# Patient Record
Sex: Male | Born: 1970 | Race: White | Hispanic: No | Smoking: Never smoker
Health system: Southern US, Community
[De-identification: ages and names within clinical notes are randomized; demographics above are authoritative.]

## PROBLEM LIST (undated history)

## (undated) ENCOUNTER — Emergency Department (HOSPITAL_COMMUNITY): Discharge status: Absent without leave

## (undated) DIAGNOSIS — Z800 Family history of malignant neoplasm of digestive organs: Secondary | ICD-10-CM

## (undated) DIAGNOSIS — T7840XA Allergy, unspecified, initial encounter: Secondary | ICD-10-CM

## (undated) DIAGNOSIS — E785 Hyperlipidemia, unspecified: Secondary | ICD-10-CM

## (undated) DIAGNOSIS — M1990 Unspecified osteoarthritis, unspecified site: Secondary | ICD-10-CM

## (undated) DIAGNOSIS — F988 Other specified behavioral and emotional disorders with onset usually occurring in childhood and adolescence: Secondary | ICD-10-CM

## (undated) HISTORY — DX: Hyperlipidemia, unspecified: E78.5

## (undated) HISTORY — DX: Other specified behavioral and emotional disorders with onset usually occurring in childhood and adolescence: F98.8

## (undated) HISTORY — PX: NASAL FRACTURE SURGERY: SHX718

## (undated) HISTORY — DX: Family history of malignant neoplasm of digestive organs: Z80.0

## (undated) HISTORY — PX: TONSILLECTOMY: SUR1361

## (undated) HISTORY — PX: WISDOM TOOTH EXTRACTION: SHX21

## (undated) HISTORY — DX: Allergy, unspecified, initial encounter: T78.40XA

## (undated) HISTORY — DX: Unspecified osteoarthritis, unspecified site: M19.90

---

## 2005-02-12 ENCOUNTER — Ambulatory Visit (HOSPITAL_COMMUNITY): Admission: RE | Admit: 2005-02-12 | Discharge: 2005-02-12 | Payer: Self-pay | Admitting: General Surgery

## 2006-12-29 ENCOUNTER — Ambulatory Visit (HOSPITAL_COMMUNITY): Admission: RE | Admit: 2006-12-29 | Discharge: 2006-12-29 | Payer: Self-pay | Admitting: Oral Surgery

## 2009-05-30 ENCOUNTER — Emergency Department (HOSPITAL_COMMUNITY)
Admission: EM | Admit: 2009-05-30 | Discharge: 2009-05-31 | Payer: Self-pay | Source: Home / Self Care | Admitting: Emergency Medicine

## 2010-09-13 NOTE — H&P (Signed)
Marcus Jones, Marcus Jones          ACCOUNT NO.:  [id number]   MEDICAL RECORD NO.:  [id number]          PATIENT TYPE:  AMB   LOCATION:                                FACILITY:  [facility]   PHYSICIAN:  Dalia Heading, M.D.  DATE OF BIRTH:  03-30-1971   DATE OF ADMISSION:  02/12/2005  DATE OF DISCHARGE:  LH                                HISTORY & PHYSICAL   CHIEF COMPLAINT:  Hematochezia with family history of colon carcinoma.   HISTORY OF PRESENT ILLNESS:  The patient is a 40 year old, white male who is  referred for endoscopic evaluation.  He needs a colonoscopy for  hematochezia.  This has been present intermittently over the past year.  Blood is noted in the toilet bowl.  No abdominal pain, weight loss, nausea,  vomiting, constipation, diarrhea, melena have been noted.  He has never had  a colonoscopy.  Both parents suffer from colon cancer.  There is no history  of hemorrhoidal disease.   PAST MEDICAL HISTORY:  Unremarkable.   PAST SURGICAL HISTORY:  1.  Tonsillectomy.  2.  Colon screening in 1994.   CURRENT MEDICATIONS:  Tylenol or ibuprofen as needed.   ALLERGIES:  AMOXICILLIN, PENICILLIN, LEVAQUIN, SULFA.   REVIEW OF SYSTEMS:  Noncontributory.   PHYSICAL EXAMINATION:  GENERAL:  The patient is a well-developed, well-  nourished, white male in no acute distress.  He is afebrile with vital signs  stable.  LUNGS:  Clear to auscultation with equal breath sounds bilaterally.  HEART:  Regular rate and rhythm without S3, S4 or murmurs.  ABDOMEN:  Soft, nontender, nondistended, no hepatosplenomegaly or masses are  noted.  RECTAL:  Deferred to the procedure.   IMPRESSION:  1.  Hematochezia.  2.  Family history of colon carcinoma.   PLAN:  The patient is scheduled for a colonoscopy on February 12, 2005.  The  risks and benefits of the procedure including bleeding and perforation were  fully explained to the patient gaining informed consent.      Dalia Heading, M.D.  Electronically Signed     MAJ/MEDQ  D:  02/04/2005  T:  02/04/2005  Job:  [id number]   cc:   Patrica Duel, M.D.  Fax: [phone]

## 2012-06-28 ENCOUNTER — Encounter: Payer: Self-pay | Admitting: Gastroenterology

## 2012-07-08 ENCOUNTER — Encounter: Payer: Self-pay | Admitting: *Deleted

## 2012-07-15 ENCOUNTER — Encounter: Payer: Self-pay | Admitting: Gastroenterology

## 2012-07-15 ENCOUNTER — Ambulatory Visit (INDEPENDENT_AMBULATORY_CARE_PROVIDER_SITE_OTHER): Payer: PRIVATE HEALTH INSURANCE | Admitting: Gastroenterology

## 2012-07-15 VITALS — BP 100/74 | HR 80 | Ht 72.0 in | Wt 195.2 lb

## 2012-07-15 DIAGNOSIS — K625 Hemorrhage of anus and rectum: Secondary | ICD-10-CM

## 2012-07-15 DIAGNOSIS — Z800 Family history of malignant neoplasm of digestive organs: Secondary | ICD-10-CM

## 2012-07-15 DIAGNOSIS — K219 Gastro-esophageal reflux disease without esophagitis: Secondary | ICD-10-CM

## 2012-07-15 MED ORDER — MOVIPREP 100 G PO SOLR: 1.0000 | Freq: Once | ORAL | Status: DC

## 2012-07-15 NOTE — Patient Instructions (Addendum)
  You have been scheduled for a colonoscopy. Please follow written instructions given to you at your visit today.  Please pick up your prep kit at the pharmacy within the next 1-3 days. If you use inhalers (even only as needed), please bring them with you on the day of your procedure.    __________________________________________________________________________________________                                               We are excited to introduce MyChart, a new best-in-class service that provides you online access to important information in your electronic medical record. We want to make it easier for you to view your health information - all in one secure location - when and where you need it. We expect MyChart will enhance the quality of care and service we provide.  When you register for MyChart, you can:    View your test results.    Request appointments and receive appointment reminders via email.    Request medication renewals.    View your medical history, allergies, medications and immunizations.    Communicate with your physician's office through a password-protected site.    Conveniently print information such as your medication lists.  To find out if MyChart is right for you, please talk to a member of our clinical staff today. We will gladly answer your questions about this free health and wellness tool.  If you are age 41 or older and want a member of your family to have access to your record, you must provide written consent by completing a proxy form available at our office. Please speak to our clinical staff about guidelines regarding accounts for patients younger than age 41.  As you activate your MyChart account and need any technical assistance, please call the MyChart technical support line at [phone]) or email your question to [email or web address]. If you email your question(s), please include your name, a return phone number and the  best time to reach you.  If you have non-urgent health-related questions, you can send a message to our office through MyChart at [email or web address]. If you have a medical emergency, call 911.  Thank you for using MyChart as your new health and wellness resource!   MyChart licensed from [employer],  4540-9811. Patents Pending.

## 2012-07-15 NOTE — Progress Notes (Signed)
History of Present Illness:  This is a 42 year old Caucasian male who has had asymptomatic hemorrhoidal bleeding for several years.  He has a strong family history of colon cancer with his father having colon cancer at age 50, his father apparently has had several relapses of his colon cancer with repeat recent surgery.  Thayer Ohm had colonoscopy apparently 7 years ago which was unremarkable.  I do not have that report for review.  He has almost daily bright red blood per rectum and has previously been diagnosed with internal hemorrhoids, and uses when necessary Cortizone salve and suppositories.  He denies abdominal pain, bowel or regularity, any upper GI complaints except for occasional GERD without associated dysphasia or hepatobiliary symptoms.  His appetite is good and his weight is stable.  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  He also has a history of allergies to Keflex, Levaquin, and sulfa antibiotics.   I have reviewed this patient's present history, medical and surgical past history, allergies and medications.     ROS:   All systems were reviewed and are negative unless otherwise stated in the HPI.    Physical Exam: Repair patient in no distress.  Blood pressure 100/74, pulse 80 and regular, weight 195 with a BMI of 26.47.  98% oxygen saturation. General well developed well nourished patient in no acute distress, appearing their stated age Eyes PERRLA, no icterus, fundoscopic exam per opthamologist Skin no lesions noted Neck supple, no adenopathy, no thyroid enlargement, no tenderness Chest clear to percussion and auscultation Heart no significant murmurs, gallops or rubs noted Abdomen no hepatosplenomegaly masses or tenderness, BS normal.  Rectal inspection normal no fissures, or fistulae noted.  There is a small posterior skin tag noted but digital exam was not performed.  He apparently had recent US to the exam and Dr. Oneta Rack and  guaiac-negative stool. Extremities no acute joint lesions, edema, phlebitis or evidence of cellulitis. Neurologic patient oriented x 3, cranial nerves intact, no focal neurologic deficits noted. Psychological mental status normal and normal affect.  Assessment and plan: Probably recurrent hemorrhoidal bleeding in a 42 year old patient with a strong family history of colon cancer in his father.  We will schedule his colonoscopy and possible hemorrhoidal injection therapy versus banding pending on his colonoscopy results.  Review of his labs shows normal CBC without evidence of anemia.  Normal B12 and liver function tests.  Also urinalysis and prostate exam was normal.  Because of his egg allergy, we will use conscious sedation with fentanyl and Versed and not propofol.  This patient has a past history of mild GERD, but I do not think he needs endoscopy at this time.  No diagnosis found.

## 2012-07-21 ENCOUNTER — Telehealth: Payer: Self-pay | Admitting: Gastroenterology

## 2012-07-21 NOTE — Telephone Encounter (Signed)
Dr. Lacretia Leigh office requested the office note from 07/15/2012 on 07/21/2012   asw  4 pages were faxed to Dr. Kathryne Sharper office on 07/21/2012  asw

## 2012-07-22 ENCOUNTER — Encounter: Payer: Self-pay | Admitting: Gastroenterology

## 2012-08-11 ENCOUNTER — Other Ambulatory Visit: Payer: PRIVATE HEALTH INSURANCE | Admitting: Gastroenterology

## 2013-02-02 ENCOUNTER — Encounter (INDEPENDENT_AMBULATORY_CARE_PROVIDER_SITE_OTHER): Payer: Self-pay

## 2013-02-02 ENCOUNTER — Ambulatory Visit (INDEPENDENT_AMBULATORY_CARE_PROVIDER_SITE_OTHER): Payer: PRIVATE HEALTH INSURANCE | Admitting: Neurology

## 2013-02-02 ENCOUNTER — Encounter: Payer: Self-pay | Admitting: Neurology

## 2013-02-02 VITALS — BP 102/64 | HR 80 | Ht 72.0 in | Wt 199.0 lb

## 2013-02-02 DIAGNOSIS — R209 Unspecified disturbances of skin sensation: Secondary | ICD-10-CM

## 2013-02-02 DIAGNOSIS — R202 Paresthesia of skin: Secondary | ICD-10-CM

## 2013-02-02 DIAGNOSIS — R531 Weakness: Secondary | ICD-10-CM

## 2013-02-02 DIAGNOSIS — R5381 Other malaise: Secondary | ICD-10-CM

## 2013-02-02 MED ORDER — GABAPENTIN 300 MG PO CAPS: ORAL_CAPSULE | ORAL | Status: DC

## 2013-02-02 NOTE — Patient Instructions (Signed)
Overall you are doing fairly well but I do want to suggest a few things today:   As far as your medications are concerned, I would like to suggest starting you on Gabapentin 300mg  three times a day  As far as diagnostic testing: I would like to order a MRI of the brain and a lab test called ANA  I would like to see you back in 4 months, sooner if we need to. Please call us with any interim questions, concerns, problems, updates or refill requests.   Please also call us for any test results so we can go over those with you on the phone.  My clinical assistant and will answer any of your questions and relay your messages to me and also relay most of my messages to you.   Our phone number is [phone]. We also have an after hours call service for urgent matters and there is a physician on-call for urgent questions. For any emergencies you know to call 911 or go to the nearest emergency room

## 2013-02-02 NOTE — Progress Notes (Signed)
[facility]    Provider:  Dr Hosie Poisson Referring Provider: Lucky Cowboy, MD Primary Care Physician:  Nadean Corwin, MD  CC:  Paresthesias and weakness  HPI:  Marcus Jones is a 42 y.o. male here as a referral from Dr. Oneta Rack for multiple motor and sensory changes  Patient sensory with multiple different motor and sensory concerns. Notes numbness and bilateral perioral region which has radiated up to periorbital. This has been going on for around one month. It has slowly started to subside. Denies any weakness facial droop associated with this. He also has a chronic history of hand paresthesias and weakness. Notes difficulty using his hands impaired motor coordination and fine motor skills. This has been going on since the 1990s, will fluctuate. Also notes periods of numbness in his hands and fingers, recently has also began to involve his toes. Notes periods of intermittent sharp stabbing pain, which he describes as trigger points. Happens in his legs and back. Also notes history of back pain. Recently has also developed headaches, these have subsided. His girlfriend notes that he is unsteady on his feet, seems to RRR. He denies feeling unsteady or falling. No lightheaded sensation upon standing. Has erectile dysfunction, well controlled with Viagra. No constipation or bowel bladder changes. He has not had an MRI of the brain or spine done. Believes he has had an EMG done in the past, around 20 years ago which was normal per the patient.   He has a sister diagnosed with MS, has a brother from a different father who has similar symptoms as the patient with an unclear diagnosis, was told he has severe neuropathy. Patient has been on Neurontin in the past for his symptoms, reports this gave good benefit.  Patient has had multiple lab work done from his primary care physician, reviewed data and was unremarkable.  Review of Systems: Out of a complete 14 system  review, the patient complains of only the following symptoms, and all other reviewed systems are negative. Positive for fatigue muscle spasms pain numbness loss of sensation  History   Social History  . Marital Status: Divorced    Spouse Name: N/A    Number of Children: 2  . Years of Education: college    Occupational History  . [employer]    Social History Main Topics  . Smoking status: Never Smoker   . Smokeless tobacco: Never Used  . Alcohol Use: Yes     Comment: 1-2 weekl   . Drug Use: No  . Sexual Activity: Not on file   Other Topics Concern  . Not on file   Social History Narrative   Patient lives at home alone.    Patient is divorced.    Patient has a degree.    Patient has 2 children.     Family History  Problem Relation Age of Onset  . Colon polyps Mother   . Colon cancer Father     x3  . Clotting disorder Father   . Prostate cancer Brother   . Clotting disorder Brother     Past Medical History  Diagnosis Date  . ADD (attention deficit disorder)   . Hyperlipemia   . Arthritis   . Family history of malignant neoplasm of gastrointestinal tract     Past Surgical History  Procedure Laterality Date  . Tonsillectomy    . Wisdom tooth extraction    . Nasal fracture surgery      Current Outpatient Prescriptions  Medication Sig Dispense Refill  .  acyclovir (ZOVIRAX) 200 MG capsule Take 200 mg by mouth daily. 200 mg- 400 mg daily      . amphetamine-dextroamphetamine (ADDERALL XR) 5 MG 24 hr capsule Take 5 mg by mouth every morning. 1-2 qd as needed      . Azelastine HCl (ASTEPRO) 0.15 % SOLN Place into the nose as needed.      . Cholecalciferol (VITAMIN D3) 5000 UNIT/ML LIQD Take by mouth daily.      . cyclobenzaprine (FLEXERIL) 10 MG tablet       . hydrocortisone 2.5 % cream Apply 1 application topically 2 (two) times daily.      Marland Kitchen levocetirizine (XYZAL) 5 MG tablet Take 5 mg by mouth every evening.      . magnesium gluconate (MAGONATE) 500 MG  tablet Take 500 mg by mouth daily.       . meloxicam (MOBIC) 15 MG tablet Take 15 mg by mouth as needed.       . pravastatin (PRAVACHOL) 40 MG tablet Take 40 mg by mouth daily.      Marland Kitchen PROCTOZONE-HC 2.5 % rectal cream       . sildenafil (VIAGRA) 100 MG tablet Take 50 mg by mouth daily as needed for erectile dysfunction.       No current facility-administered medications for this visit.    Allergies as of 02/02/2013 - Review Complete 02/02/2013  Allergen Reaction Noted  . Amoxicillin anhydrous [amoxicillin]  02/02/2013  . Doxycycline  07/08/2012  . Eggs or egg-derived products  07/08/2012  . Keflex [cephalexin]  07/08/2012  . Levaquin [levofloxacin]  07/08/2012  . Neomycin  02/02/2013  . Sulfa antibiotics  07/08/2012    Vitals: BP 102/64  Pulse 80  Ht 6' (1.829 m)  Wt 199 lb (90.266 kg)  BMI 26.98 kg/m2 Last Weight:  Wt Readings from Last 1 Encounters:  02/02/13 199 lb (90.266 kg)   Last Height:   Ht Readings from Last 1 Encounters:  02/02/13 6' (1.829 m)     Physical exam: Exam: Gen: NAD, conversant Eyes: anicteric sclerae, moist conjunctivae HENT: Atraumatic, oropharynx clear Neck: Trachea midline; supple,  Lungs: CTA, no wheezing, rales, rhonic                          CV: RRR, no MRG Abdomen: Soft, non-tender;  Extremities: No peripheral edema  Skin: Normal temperature, no rash,  Psych: Appropriate affect, pleasant  Neuro: MS: AA&Ox3, appropriately interactive, normal affect   Speech: fluent w/o paraphasic error  Memory: good recent and remote recall  CN: PERRL, EOMI no nystagmus, no ptosis, sensation intact to LT V1-V3 bilat, face symmetric, no weakness, hearing grossly intact, palate elevates symmetrically, shoulder shrug 5/5 bilat,  tongue protrudes midline, no fasiculations noted.  Motor: normal bulk and tone Strength: 5/5  In all extremities  Coord: rapid alternating and point-to-point (FNF, HTS) movements intact.  Reflexes: symmetrical, bilat  downgoing toes  Sens: LT intact in all extremities  Gait: posture, stance, stride and arm-swing normal. Tandem gait intact. Able to walk on heels and toes. Romberg absent.   Assessment:  After physical and neurologic examination, review of laboratory studies, imaging, neurophysiology testing and pre-existing records, assessment will be reviewed on the problem list.  Plan:  Treatment plan and additional workup will be reviewed under Problem List.  1)Paresthesias 2)Weakness  Mr Mcnamee is a pleasant 41 year old male presenting for initial evaluation of multiple motor and sensory concerns. Unclear etiology of his symptoms. Based on  age and presenting symptoms would need to rule out multiple sclerosis. Symptoms are consistent with diagnosis of fibromyalgia, though will need to work up other etiologies prior to making this diagnosis. Will check brain MRI with and without contrast to check for MS. Will check ANA. Will restart patient on Neurontin 300 mg 3 times a day.Consider repeating EMG/NCS in the future. Followup in 4 months or earlier as needed.

## 2013-02-03 ENCOUNTER — Encounter: Payer: Self-pay | Admitting: Neurology

## 2013-02-17 ENCOUNTER — Ambulatory Visit (INDEPENDENT_AMBULATORY_CARE_PROVIDER_SITE_OTHER): Payer: PRIVATE HEALTH INSURANCE

## 2013-02-17 ENCOUNTER — Other Ambulatory Visit: Payer: PRIVATE HEALTH INSURANCE

## 2013-02-17 DIAGNOSIS — R202 Paresthesia of skin: Secondary | ICD-10-CM

## 2013-02-17 DIAGNOSIS — R209 Unspecified disturbances of skin sensation: Secondary | ICD-10-CM

## 2013-02-17 DIAGNOSIS — R5381 Other malaise: Secondary | ICD-10-CM

## 2013-02-17 DIAGNOSIS — R531 Weakness: Secondary | ICD-10-CM

## 2013-02-17 MED ORDER — GADOPENTETATE DIMEGLUMINE 469.01 MG/ML IV SOLN: 18.0000 mL | Freq: Once | INTRAVENOUS | Status: AC | PRN

## 2013-02-24 NOTE — Progress Notes (Signed)
Quick Note:  Shared with patient normal MRI results thru VMmessage ______ 

## 2013-03-29 ENCOUNTER — Other Ambulatory Visit: Payer: Self-pay | Admitting: Physician Assistant

## 2013-03-29 MED ORDER — LEVOCETIRIZINE DIHYDROCHLORIDE 5 MG PO TABS: 5.0000 mg | ORAL_TABLET | Freq: Every evening | ORAL | Status: DC

## 2013-04-07 ENCOUNTER — Encounter: Payer: Self-pay | Admitting: Internal Medicine

## 2013-04-08 ENCOUNTER — Encounter: Payer: Self-pay | Admitting: Physician Assistant

## 2013-04-08 ENCOUNTER — Ambulatory Visit (INDEPENDENT_AMBULATORY_CARE_PROVIDER_SITE_OTHER): Payer: PRIVATE HEALTH INSURANCE | Admitting: Physician Assistant

## 2013-04-08 VITALS — BP 100/70 | HR 68 | Temp 98.1°F | Resp 16 | Ht 72.0 in | Wt 198.0 lb

## 2013-04-08 DIAGNOSIS — E559 Vitamin D deficiency, unspecified: Secondary | ICD-10-CM

## 2013-04-08 DIAGNOSIS — E782 Mixed hyperlipidemia: Secondary | ICD-10-CM

## 2013-04-08 DIAGNOSIS — E785 Hyperlipidemia, unspecified: Secondary | ICD-10-CM

## 2013-04-08 DIAGNOSIS — F419 Anxiety disorder, unspecified: Secondary | ICD-10-CM

## 2013-04-08 LAB — CBC WITH DIFFERENTIAL/PLATELET
Basophils Absolute: 0 10*3/uL (ref 0.0–0.1)
Basophils Relative: 0 % (ref 0–1)
HCT: 44.2 % (ref 39.0–52.0)
Lymphocytes Relative: 36 % (ref 12–46)
Lymphs Abs: 2.2 10*3/uL (ref 0.7–4.0)
MCH: 33.1 pg (ref 26.0–34.0)
MCHC: 35.5 g/dL (ref 30.0–36.0)
MCV: 93.1 fL (ref 78.0–100.0)
Monocytes Relative: 9 % (ref 3–12)
RBC: 4.75 MIL/uL (ref 4.22–5.81)
RDW: 12.9 % (ref 11.5–15.5)

## 2013-04-08 LAB — LIPID PANEL
Cholesterol: 178 mg/dL (ref 0–200)
HDL: 45 mg/dL (ref >39)
LDL Cholesterol: 102 mg/dL — ABNORMAL HIGH (ref 0–99)
Total CHOL/HDL Ratio: 4 Ratio
Triglycerides: 154 mg/dL — ABNORMAL HIGH (ref <150)
VLDL: 31 mg/dL (ref 0–40)

## 2013-04-08 LAB — HEPATIC FUNCTION PANEL
ALT: 14 U/L (ref 0–53)
Albumin: 4.6 g/dL (ref 3.5–5.2)
Bilirubin, Direct: 0.1 mg/dL (ref 0.0–0.3)
Indirect Bilirubin: 0.4 mg/dL (ref 0.0–0.9)
Total Bilirubin: 0.5 mg/dL (ref 0.3–1.2)
Total Protein: 7.6 g/dL (ref 6.0–8.3)

## 2013-04-08 LAB — BASIC METABOLIC PANEL WITH GFR
BUN: 19 mg/dL (ref 6–23)
CO2: 26 mEq/L (ref 19–32)
Glucose, Bld: 101 mg/dL — ABNORMAL HIGH (ref 70–99)

## 2013-04-08 NOTE — Progress Notes (Signed)
HPI Patient presents for 3 month follow up with hypertension, hyperlipidemia, prediabetes and vitamin D. Patient's blood pressure has been controlled at home. Patient denies chest pain, shortness of breath, dizziness.  Patient's cholesterol is diet controlled. In addition they are on Pravastatin and denies myalgias. Last LDL was 111.  Patient was sent to Neuro and was had a normal MRI of brain and the neurologist said that likely the symptoms are from [facility] and put him back on gabapentin and he states he doing better now.  Patient is on Vitamin D supplement.  Current Medications:  Current Outpatient Prescriptions on File Prior to Visit  Medication Sig Dispense Refill  . acyclovir (ZOVIRAX) 200 MG capsule Take 200 mg by mouth daily. 200 mg- 400 mg daily      . amphetamine-dextroamphetamine (ADDERALL XR) 5 MG 24 hr capsule Take 5 mg by mouth every morning. 1-2 qd as needed      . Azelastine HCl (ASTEPRO) 0.15 % SOLN Place into the nose as needed.      . Cholecalciferol (VITAMIN D3) 5000 UNIT/ML LIQD Take by mouth daily.      . cyclobenzaprine (FLEXERIL) 10 MG tablet       . gabapentin (NEURONTIN) 300 MG capsule 1 capsule qhs for 3 days then increase to bid for 3 days then increase to 1 capsule three times a day  90 capsule  3  . hydrocortisone 2.5 % cream Apply 1 application topically 2 (two) times daily.      Marland Kitchen levocetirizine (XYZAL) 5 MG tablet Take 1 tablet (5 mg total) by mouth every evening.  30 tablet  2  . meloxicam (MOBIC) 15 MG tablet Take 15 mg by mouth as needed.       . pravastatin (PRAVACHOL) 40 MG tablet Take 40 mg by mouth daily.      Marland Kitchen PROCTOZONE-HC 2.5 % rectal cream       . sildenafil (VIAGRA) 100 MG tablet Take 50 mg by mouth daily as needed for erectile dysfunction.       No current facility-administered medications on file prior to visit.   Medical History:  Past Medical History  Diagnosis Date  . ADD (attention deficit disorder)   . Family history of malignant neoplasm of  gastrointestinal tract   . Arthritis   . Hyperlipemia   . Allergy   . Anxiety    Allergies:  Allergies  Allergen Reactions  . Amoxicillin Anhydrous [Amoxicillin]   . Doxycycline   . Eggs Or Egg-Derived Products   . Keflex [Cephalexin]   . Levaquin [Levofloxacin]   . Neomycin   . Sulfa Antibiotics     ROS Constitutional: Denies fever, chills, weight loss/gain, headaches, insomnia, fatigue, night sweats, and change in appetite. Eyes: Denies redness, blurred vision, diplopia, discharge, itchy, watery eyes.  ENT: Denies discharge, congestion, post nasal drip, sore throat, earache, dental pain, Tinnitus, Vertigo, Sinus pain, snoring.  Cardio: Denies chest pain, palpitations, irregular heartbeat,  dyspnea, diaphoresis, orthopnea, PND, claudication, edema Respiratory: denies cough, dyspnea,pleurisy, hoarseness, wheezing.  Gastrointestinal: Denies dysphagia, heartburn,  water brash, pain, cramps, nausea, vomiting, bloating, diarrhea, constipation, hematemesis, melena, hematochezia,  hemorrhoids Genitourinary: Denies dysuria, frequency, urgency, nocturia, hesitancy, discharge, hematuria, flank pain Musculoskeletal: Denies arthralgia, myalgia, stiffness, Jt. Swelling, pain, limp, and strain/sprain. Skin: Denies pruritis, rash, hives, warts, acne, eczema, changing in skin lesion Neuro: + paresthesia Denies Weakness, tremor, incoordination, spasms,  pain Psychiatric: Denies confusion, memory loss, sensory loss Endocrine: Denies change in weight, skin, hair change, nocturia, Diabetic Polys,  Denies visual blurring, hyper /hypo glycemic episodes.  Heme/Lymph: Denies Excessive bleeding, bruising, enlarged lymph nodes  Family history- Review and unchanged Social history- Review and unchanged Physical Exam: Filed Vitals:   04/08/13 0853  BP: 100/70  Pulse: 68  Temp: 98.1 F (36.7 C)  Resp: 16   Filed Weights   04/08/13 0853  Weight: 198 lb (89.812 kg)   General Appearance: Well  nourished, in no apparent distress. Eyes: PERRLA, EOMs, conjunctiva no swelling or erythema, normal fundi and vessels. Sinuses: No Frontal/maxillary tenderness ENT/Mouth: Ext aud canals clear, with TMs without erythema, bulging.No erythema, swelling, or exudate on post pharynx.  Tonsils not swollen or erythematous. Hearing normal.  Neck: Supple, thyroid normal.  Respiratory: Respiratory effort normal, BS equal bilaterally without rales, rhonchi, wheezing or stridor.  Cardio: Heart sounds normal, regular rate and rhythm without murmurs, rubs or gallops. Peripheral pulses brisk and equal bilaterally, without edema.  Abdomen: Flat, soft, with bowel sounds. Non tender, no guarding, rebound, hernias, masses, or organomegaly.  Lymphatics: Non tender without lymphadenopathy.  Musculoskeletal: Full ROM all peripheral extremities, joint stability, 5/5 strength, and normal gait. Skin: Warm, dry without rashes, lesions, ecchymosis.  Neuro: Cranial nerves intact, reflexes equal bilaterally. Normal muscle tone, no cerebellar symptoms Psych: Awake and oriented X 3, normal affect, Insight and Judgment appropriate.   Assessment and Plan:  Hypertension: Continue medication, monitor blood pressure at home. Continue DASH diet. Cholesterol: Continue diet and exercise. Check cholesterol.  Vitamin D Def- check level and continue medications.  Neuropathy- continue gabapentin.   Quentin Mulling 9:04 AM

## 2013-06-06 ENCOUNTER — Ambulatory Visit: Payer: PRIVATE HEALTH INSURANCE | Admitting: Neurology

## 2013-06-29 ENCOUNTER — Encounter: Payer: Self-pay | Admitting: Internal Medicine

## 2013-06-29 ENCOUNTER — Ambulatory Visit (INDEPENDENT_AMBULATORY_CARE_PROVIDER_SITE_OTHER): Payer: PRIVATE HEALTH INSURANCE | Admitting: Internal Medicine

## 2013-06-29 VITALS — BP 104/76 | HR 80 | Temp 98.2°F | Resp 18 | Ht 72.5 in | Wt 197.0 lb

## 2013-06-29 DIAGNOSIS — Z111 Encounter for screening for respiratory tuberculosis: Secondary | ICD-10-CM

## 2013-06-29 DIAGNOSIS — R740 Nonspecific elevation of levels of transaminase and lactic acid dehydrogenase [LDH]: Secondary | ICD-10-CM

## 2013-06-29 DIAGNOSIS — R7402 Elevation of levels of lactic acid dehydrogenase (LDH): Secondary | ICD-10-CM

## 2013-06-29 DIAGNOSIS — E782 Mixed hyperlipidemia: Secondary | ICD-10-CM | POA: Insufficient documentation

## 2013-06-29 DIAGNOSIS — E559 Vitamin D deficiency, unspecified: Secondary | ICD-10-CM

## 2013-06-29 DIAGNOSIS — Z125 Encounter for screening for malignant neoplasm of prostate: Secondary | ICD-10-CM

## 2013-06-29 DIAGNOSIS — F988 Other specified behavioral and emotional disorders with onset usually occurring in childhood and adolescence: Secondary | ICD-10-CM

## 2013-06-29 DIAGNOSIS — Z1212 Encounter for screening for malignant neoplasm of rectum: Secondary | ICD-10-CM

## 2013-06-29 DIAGNOSIS — Z0000 Encounter for general adult medical examination without abnormal findings: Secondary | ICD-10-CM

## 2013-06-29 DIAGNOSIS — Z113 Encounter for screening for infections with a predominantly sexual mode of transmission: Secondary | ICD-10-CM

## 2013-06-29 DIAGNOSIS — Z79899 Other long term (current) drug therapy: Secondary | ICD-10-CM

## 2013-06-29 LAB — BASIC METABOLIC PANEL WITH GFR
BUN: 15 mg/dL (ref 6–23)
CO2: 28 mEq/L (ref 19–32)
Calcium: 9.4 mg/dL (ref 8.4–10.5)
Chloride: 104 mEq/L (ref 96–112)
Creat: 1.15 mg/dL (ref 0.50–1.35)
GFR, Est Non African American: 79 mL/min
GLUCOSE: 93 mg/dL (ref 70–99)
POTASSIUM: 4.3 meq/L (ref 3.5–5.3)
SODIUM: 141 meq/L (ref 135–145)

## 2013-06-29 LAB — CBC WITH DIFFERENTIAL/PLATELET
Basophils Absolute: 0 10*3/uL (ref 0.0–0.1)
Basophils Relative: 0 % (ref 0–1)
EOS ABS: 0.2 10*3/uL (ref 0.0–0.7)
Eosinophils Relative: 3 % (ref 0–5)
HCT: 44.3 % (ref 39.0–52.0)
Hemoglobin: 15.4 g/dL (ref 13.0–17.0)
LYMPHS ABS: 2.1 10*3/uL (ref 0.7–4.0)
Lymphocytes Relative: 40 % (ref 12–46)
MCH: 31.7 pg (ref 26.0–34.0)
MCHC: 34.8 g/dL (ref 30.0–36.0)
MCV: 91.2 fL (ref 78.0–100.0)
Monocytes Absolute: 0.4 10*3/uL (ref 0.1–1.0)
Monocytes Relative: 7 % (ref 3–12)
NEUTROS ABS: 2.7 10*3/uL (ref 1.7–7.7)
NEUTROS PCT: 50 % (ref 43–77)
Platelets: 230 10*3/uL (ref 150–400)
RBC: 4.86 MIL/uL (ref 4.22–5.81)
RDW: 12.7 % (ref 11.5–15.5)
WBC: 5.3 10*3/uL (ref 4.0–10.5)

## 2013-06-29 LAB — MAGNESIUM: Magnesium: 1.8 mg/dL (ref 1.5–2.5)

## 2013-06-29 LAB — HEMOGLOBIN A1C
Hgb A1c MFr Bld: 5.4 % (ref <5.7)
MEAN PLASMA GLUCOSE: 108 mg/dL (ref <117)

## 2013-06-29 LAB — LIPID PANEL
CHOLESTEROL: 161 mg/dL (ref 0–200)
HDL: 42 mg/dL (ref >39)
LDL CALC: 94 mg/dL (ref 0–99)
Total CHOL/HDL Ratio: 3.8 Ratio
Triglycerides: 124 mg/dL (ref <150)
VLDL: 25 mg/dL (ref 0–40)

## 2013-06-29 LAB — HEPATIC FUNCTION PANEL
ALBUMIN: 4.5 g/dL (ref 3.5–5.2)
ALT: 19 U/L (ref 0–53)
AST: 20 U/L (ref 0–37)
Alkaline Phosphatase: 42 U/L (ref 39–117)
BILIRUBIN DIRECT: 0.1 mg/dL (ref 0.0–0.3)
Indirect Bilirubin: 0.7 mg/dL (ref 0.2–1.2)
Total Bilirubin: 0.8 mg/dL (ref 0.2–1.2)
Total Protein: 7 g/dL (ref 6.0–8.3)

## 2013-06-29 MED ORDER — GABAPENTIN 600 MG PO TABS: 600.0000 mg | ORAL_TABLET | Freq: Every day | ORAL | Status: DC

## 2013-06-29 NOTE — Patient Instructions (Signed)

## 2013-06-29 NOTE — Progress Notes (Signed)
Patient ID: Marcus Jones, male   DOB: 06-May-1970, 43 y.o.   MRN: [id number]   Annual Screening Comprehensive Examination  This very nice 43 y.o.  DWM presents for complete physical.  Patient has been followed for HTN, Diabetes  Prediabetes, Hyperlipidemia, and Vitamin D Deficiency.   Today's BP: 104/76 mmHg. Patient denies any cardiac symptoms as chest pain, palpitations, shortness of breath, dizziness or ankle swelling.   Patient's hyperlipidemia is controlled with diet and Pravastatin. Last lipids as below are at goal.  Patient denies myalgias or other medication SE's. Lab Results  Component Value Date   CHOL 178 04/08/2013   HDL 45 04/08/2013   LDLCALC 102* 04/08/2013   TRIG 154* 04/08/2013   CHOLHDL 4.0 04/08/2013    Patient has prediabetes with A1c 5.7% in Feb 2013 and 5.9% in Nov 2013 and with last A1c of 5.6% in Aug 2014. Patient denies reactive hypoglycemic symptoms, visual blurring, diabetic polys, or paresthesias.    Patient has ADD and is followed regularly by Dr Evelene Croon.   Finally, patient has history of Vitamin D Deficiency 39 in 2013 with last vitamin D 87 in Sept 2014.   Medication List     acyclovir 200 MG capsule  Commonly known as:  ZOVIRAX  Take 200 mg by mouth daily. 200 mg- 400 mg daily     amphetamine-dextroamphetamine 5 MG 24 hr capsule  Commonly known as:  ADDERALL XR  Take 5 mg by mouth every morning. 1-2 qd as needed     cyclobenzaprine 10 MG tablet  Commonly known as:  FLEXERIL     gabapentin 600 MG tablet  Commonly known as:  NEURONTIN  Take 1 tablet (600 mg total) by mouth daily.     hydrocortisone 2.5 % cream  Apply 1 application topically 2 (two) times daily.     levocetirizine 5 MG tablet  Commonly known as:  XYZAL  Take 1 tablet (5 mg total) by mouth every evening.     meloxicam 15 MG tablet  Commonly known as:  MOBIC  Take 15 mg by mouth as needed.     pravastatin 40 MG tablet  Commonly known as:  PRAVACHOL  Take 40 mg by  mouth daily.     PROCTOZONE-HC 2.5 % rectal cream  Generic drug:  hydrocortisone     RITALIN 10 MG tablet  Generic drug:  methylphenidate  Take 10 mg by mouth daily. Takes PRN in pm     sildenafil 100 MG tablet  Commonly known as:  VIAGRA  Take 50 mg by mouth daily as needed for erectile dysfunction.     Vitamin D3 5000 UNIT/ML Liqd  Take by mouth daily.        Allergies  Allergen Reactions  . Amoxicillin Anhydrous [Amoxicillin]   . Doxycycline   . Eggs Or Egg-Derived Products   . Keflex [Cephalexin]   . Levaquin [Levofloxacin]   . Neomycin   . Sulfa Antibiotics     Past Medical History  Diagnosis Date  . ADD (attention deficit disorder)   . Family history of malignant neoplasm of gastrointestinal tract   . Arthritis   . Hyperlipemia   . Allergy   . Anxiety     Past Surgical History  Procedure Laterality Date  . Tonsillectomy    . Wisdom tooth extraction    . Nasal fracture surgery      Family History  Problem Relation Age of Onset  . Colon polyps Mother   . Colon cancer Father  x3  . Clotting disorder Father   . Prostate cancer Brother   . Clotting disorder Brother     History   Social History  . Marital Status: Divorced    Spouse Name: N/A    Number of Children: 2  . Years of Education: college    Occupational History  . [employer]    Social History Main Topics  . Smoking status: Never Smoker   . Smokeless tobacco: Never Used  . Alcohol Use: Yes     Comment: 1-2 weekl   . Drug Use: No  . Sexual Activity: Not on file    Social History Narrative   Patient is divorced.    Patient works as a Designer, television/film setpharmaceutical Rep.   Patient has 2 children - [id number] & 42 yo.Marland Kitchen.     ROS Constitutional: Denies fever, chills, weight loss/gain, headaches, insomnia, fatigue, night sweats, and change in appetite. Eyes: Denies redness, blurred vision, diplopia, discharge, itchy, watery eyes.  ENT: Denies discharge, congestion, post nasal drip, epistaxis, sore  throat, earache, hearing loss, dental pain, Tinnitus, Vertigo, Sinus pain, snoring.  Cardio: Denies chest pain, palpitations, irregular heartbeat, syncope, dyspnea, diaphoresis, orthopnea, PND, claudication, edema Respiratory: denies cough, dyspnea, DOE, pleurisy, hoarseness, laryngitis, wheezing.  Gastrointestinal: Denies dysphagia, heartburn, reflux, water brash, pain, cramps, nausea, vomiting, bloating, diarrhea, constipation, hematemesis, melena, hematochezia, jaundice, hemorrhoids Genitourinary: Denies dysuria, frequency, urgency, nocturia, hesitancy, discharge, hematuria, flank pain Musculoskeletal: Denies arthralgia, myalgia, stiffness, Jt. Swelling, pain, limp, and strain/sprain. Skin: Denies puritis, rash, hives, warts, acne, eczema, changing in skin lesion Neuro: No weakness, tremor, incoordination, spasms, paresthesia, pain Psychiatric: Denies confusion, memory loss, sensory loss Endocrine: Denies change in weight, skin, hair change, nocturia, and paresthesia, diabetic polys, visual blurring, hyper / hypo glycemic episodes.  Heme/Lymph: No excessive bleeding, bruising, or elarged lymph nodes.  BP: 104/76  Pulse: 80  Temp: 98.2 F (36.8 C)  Resp: 18   Estimated body mass index is 26.34 kg/(m^2) as calculated from the following:   Height as of this encounter: 6' 0.5" (1.842 m).   Weight as of this encounter: 197 lb (89.359 kg).  Physical Exam General Appearance: Well nourished, in no apparent distress. Eyes: PERRLA, EOMs, conjunctiva no swelling or erythema, normal fundi and vessels. Sinuses: No frontal/maxillary tenderness ENT/Mouth: EACs patent / TMs  nl. Nares clear without erythema, swelling, mucoid exudates. Oral hygiene is good. No erythema, swelling, or exudate. Tongue normal, non-obstructing. Tonsils not swollen or erythematous. Hearing normal.  Neck: Supple, thyroid normal. No bruits, nodes or JVD. Respiratory: Respiratory effort normal.  BS equal and clear bilateral  without rales, rhonci, wheezing or stridor. Cardio: Heart sounds are normal with regular rate and rhythm and no murmurs, rubs or gallops. Peripheral pulses are normal and equal bilaterally without edema. No aortic or femoral bruits. Chest: symmetric with normal excursions and percussion.  Abdomen: Flat, soft, with bowl sounds. Nontender, no guarding, rebound, hernias, masses, or organomegaly.  Lymphatics: Non tender without lymphadenopathy.  Genitourinary: No hernias.Testes nl. DRE - prostate nl for age - smooth & firm w/o nodules. Musculoskeletal: Full ROM all peripheral extremities, joint stability, 5/5 strength, and normal gait. Skin: Warm and dry without rashes, lesions, cyanosis, clubbing or  ecchymosis.  Neuro: Cranial nerves intact, reflexes equal bilaterally. Normal muscle tone, no cerebellar symptoms. Sensation intact.  Pysch: Awake and oriented X 3, normal affect, insight and judgment appropriate.   Assessment and Plan  1. Annual Screening Examination 2. Hyperlipidemia 3. Pre Diabetes 4. Vitamin D Deficiency 5. ADD  Continue  prudent diet as discussed, weight control, BP monitoring, regular exercise, and medications as discussed.  Discussed med effects and SE's. Routine screening labs and tests as requested with regular follow-up as recommended.

## 2013-06-30 LAB — HEPATITIS B SURFACE ANTIBODY,QUALITATIVE: HEP B S AB: POSITIVE — AB

## 2013-06-30 LAB — URINALYSIS, MICROSCOPIC ONLY
Bacteria, UA: NONE SEEN
CASTS: NONE SEEN
CRYSTALS: NONE SEEN
Squamous Epithelial / LPF: NONE SEEN

## 2013-06-30 LAB — HIV ANTIBODY (ROUTINE TESTING W REFLEX): HIV: NONREACTIVE

## 2013-06-30 LAB — HEPATITIS B CORE ANTIBODY, TOTAL: HEP B C TOTAL AB: NONREACTIVE

## 2013-06-30 LAB — RPR

## 2013-06-30 LAB — PSA: PSA: 0.34 ng/mL (ref <=4.00)

## 2013-06-30 LAB — HEPATITIS C ANTIBODY: HCV AB: NEGATIVE

## 2013-06-30 LAB — INSULIN, FASTING: Insulin fasting, serum: 17 u[IU]/mL (ref 3–28)

## 2013-06-30 LAB — HEPATITIS A ANTIBODY, TOTAL: Hep A Total Ab: NONREACTIVE

## 2013-06-30 LAB — VITAMIN D 25 HYDROXY (VIT D DEFICIENCY, FRACTURES): Vit D, 25-Hydroxy: 103 ng/mL — ABNORMAL HIGH (ref 30–89)

## 2013-06-30 LAB — HEPATITIS B E ANTIBODY: HEPATITIS BE ANTIBODY: NEGATIVE

## 2013-06-30 LAB — TESTOSTERONE: Testosterone: 459 ng/dL (ref 300–890)

## 2013-06-30 LAB — MICROALBUMIN / CREATININE URINE RATIO
CREATININE, URINE: 221.7 mg/dL
Microalb Creat Ratio: 2.3 mg/g (ref 0.0–30.0)
Microalb, Ur: 0.5 mg/dL (ref 0.00–1.89)

## 2013-06-30 LAB — TSH: TSH: 1.811 u[IU]/mL (ref 0.350–4.500)

## 2013-06-30 LAB — VITAMIN B12: Vitamin B-12: 433 pg/mL (ref 211–911)

## 2013-07-04 LAB — TB SKIN TEST
Induration: 0 mm
TB SKIN TEST: NEGATIVE

## 2013-07-21 ENCOUNTER — Other Ambulatory Visit: Payer: Self-pay | Admitting: *Deleted

## 2013-07-21 MED ORDER — GABAPENTIN 300 MG PO CAPS: ORAL_CAPSULE | ORAL | Status: DC

## 2013-09-30 ENCOUNTER — Ambulatory Visit: Payer: Self-pay | Admitting: Physician Assistant

## 2013-09-30 ENCOUNTER — Encounter: Payer: Self-pay | Admitting: Physician Assistant

## 2013-09-30 ENCOUNTER — Ambulatory Visit (INDEPENDENT_AMBULATORY_CARE_PROVIDER_SITE_OTHER): Payer: PRIVATE HEALTH INSURANCE | Admitting: Physician Assistant

## 2013-09-30 VITALS — BP 102/72 | HR 72 | Temp 97.7°F | Resp 16 | Ht 72.0 in | Wt 199.0 lb

## 2013-09-30 DIAGNOSIS — F988 Other specified behavioral and emotional disorders with onset usually occurring in childhood and adolescence: Secondary | ICD-10-CM

## 2013-09-30 DIAGNOSIS — Z79899 Other long term (current) drug therapy: Secondary | ICD-10-CM

## 2013-09-30 DIAGNOSIS — E782 Mixed hyperlipidemia: Secondary | ICD-10-CM

## 2013-09-30 DIAGNOSIS — E559 Vitamin D deficiency, unspecified: Secondary | ICD-10-CM

## 2013-09-30 LAB — HEPATIC FUNCTION PANEL
ALT: 18 U/L (ref 0–53)
AST: 21 U/L (ref 0–37)
Albumin: 4.3 g/dL (ref 3.5–5.2)
Alkaline Phosphatase: 43 U/L (ref 39–117)
BILIRUBIN INDIRECT: 0.5 mg/dL (ref 0.2–1.2)
Bilirubin, Direct: 0.1 mg/dL (ref 0.0–0.3)
Total Bilirubin: 0.6 mg/dL (ref 0.2–1.2)
Total Protein: 7.1 g/dL (ref 6.0–8.3)

## 2013-09-30 LAB — LIPID PANEL
Cholesterol: 195 mg/dL (ref 0–200)
HDL: 43 mg/dL (ref >39)
LDL CALC: 120 mg/dL — AB (ref 0–99)
Total CHOL/HDL Ratio: 4.5 Ratio
Triglycerides: 162 mg/dL — ABNORMAL HIGH (ref <150)
VLDL: 32 mg/dL (ref 0–40)

## 2013-09-30 LAB — CBC WITH DIFFERENTIAL/PLATELET
BASOS PCT: 1 % (ref 0–1)
Basophils Absolute: 0.1 10*3/uL (ref 0.0–0.1)
EOS ABS: 0.5 10*3/uL (ref 0.0–0.7)
Eosinophils Relative: 9 % — ABNORMAL HIGH (ref 0–5)
HCT: 43.1 % (ref 39.0–52.0)
Hemoglobin: 15.2 g/dL (ref 13.0–17.0)
Lymphocytes Relative: 34 % (ref 12–46)
Lymphs Abs: 2 10*3/uL (ref 0.7–4.0)
MCH: 32.1 pg (ref 26.0–34.0)
MCHC: 35.3 g/dL (ref 30.0–36.0)
MCV: 90.9 fL (ref 78.0–100.0)
Monocytes Absolute: 0.5 10*3/uL (ref 0.1–1.0)
Monocytes Relative: 8 % (ref 3–12)
NEUTROS ABS: 2.9 10*3/uL (ref 1.7–7.7)
Neutrophils Relative %: 48 % (ref 43–77)
PLATELETS: 205 10*3/uL (ref 150–400)
RBC: 4.74 MIL/uL (ref 4.22–5.81)
RDW: 13 % (ref 11.5–15.5)
WBC: 6 10*3/uL (ref 4.0–10.5)

## 2013-09-30 LAB — TSH: TSH: 2.057 u[IU]/mL (ref 0.350–4.500)

## 2013-09-30 LAB — BASIC METABOLIC PANEL WITH GFR
BUN: 12 mg/dL (ref 6–23)
CALCIUM: 9.2 mg/dL (ref 8.4–10.5)
CO2: 26 mEq/L (ref 19–32)
Chloride: 103 mEq/L (ref 96–112)
Creat: 1.11 mg/dL (ref 0.50–1.35)
GFR, Est Non African American: 81 mL/min
Glucose, Bld: 91 mg/dL (ref 70–99)
POTASSIUM: 4.2 meq/L (ref 3.5–5.3)
SODIUM: 140 meq/L (ref 135–145)

## 2013-09-30 LAB — MAGNESIUM: MAGNESIUM: 2 mg/dL (ref 1.5–2.5)

## 2013-09-30 NOTE — Patient Instructions (Signed)

## 2013-09-30 NOTE — Progress Notes (Signed)
Assessment and Plan:  Hypertension: Continue medication, monitor blood pressure at home. Continue DASH diet. Cholesterol: Continue diet and exercise. Check cholesterol-? Can do only multiple times a week, follow very strict diet ADD-  Continue ADD medication, helps with focus, no AE's. The patient was counseled on the addictive nature of the medication and was encouraged to take drug holidays when not needed.   Vitamin D Def- check level and continue medications.   Continue diet and meds as discussed. Further disposition pending results of labs.  HPI 43 y.o. male  presents for 3 month follow up with hypertension, hyperlipidemia, prediabetes and vitamin D. His blood pressure has been controlled at home, today their BP is BP: 102/72 mmHg He does workout. He denies chest pain, shortness of breath, dizziness.  He is on cholesterol medication but he is taking the cholesterol medication sporadically due to increasing muscle aches. His cholesterol is at goal. The cholesterol last visit was:   Lab Results  Component Value Date   CHOL 161 06/29/2013   HDL 42 06/29/2013   LDLCALC 94 06/29/2013   TRIG 124 06/29/2013   CHOLHDL 3.8 06/29/2013    Last A1C in the office was:  Lab Results  Component Value Date   HGBA1C 5.4 06/29/2013   Patient is on Vitamin D supplement.   Patient is on an ADD medication, he states that the medication is helping and he denies any ADD's.  He has several aches and pains distributed sporadically, he has seen neuro and has stopped dairy, he states that this has gotten better and he is going to see Dr. Laneta Simmers.   Current Medications:   acyclovir 200 MG capsule  Commonly known as:  ZOVIRAX  Take 200 mg by mouth daily. 200 mg- 400 mg daily     amphetamine-dextroamphetamine 5 MG 24 hr capsule  Commonly known as:  ADDERALL XR  Take 5 mg by mouth every morning. 1-2 qd as needed     gabapentin 300 MG capsule  Commonly known as:  NEURONTIN  Patient takes 2 caps=600 mg daily      hydrocortisone 2.5 % cream  Apply 1 application topically 2 (two) times daily.     levocetirizine 5 MG tablet  Commonly known as:  XYZAL  Take 1 tablet (5 mg total) by mouth every evening.     meloxicam 15 MG tablet  Commonly known as:  MOBIC  Take 15 mg by mouth as needed.     pravastatin 40 MG tablet  Commonly known as:  PRAVACHOL  Take 40 mg by mouth daily.     PROCTOZONE-HC 2.5 % rectal cream  Generic drug:  hydrocortisone     sildenafil 100 MG tablet  Commonly known as:  VIAGRA  Take 50 mg by mouth daily as needed for erectile dysfunction.     Vitamin D3 5000 UNIT/ML Liqd  Take by mouth daily.        Medical History:  Past Medical History  Diagnosis Date  . ADD (attention deficit disorder)   . Family history of malignant neoplasm of gastrointestinal tract   . Arthritis   . Hyperlipemia   . Allergy   . Anxiety    Allergies:  Allergies  Allergen Reactions  . Amoxicillin Anhydrous [Amoxicillin]   . Doxycycline   . Eggs Or Egg-Derived Products   . Keflex [Cephalexin]   . Levaquin [Levofloxacin]   . Neomycin   . Sulfa Antibiotics      Review of Systems: [X]  = complains of  [ ]  =  denies  General: Fatigue [ ]  Fever [ ]  Chills [ ]  Weakness [ ]   Insomnia [ ]  Eyes: Redness [ ]  Blurred vision [ ]  Diplopia [ ]   ENT: Congestion [ ]  Sinus Pain [ ]  Post Nasal Drip [ ]  Sore Throat [ ]  Earache [ ]   Cardiac: Chest pain/pressure [ ]  SOB [ ]  Orthopnea [ ]   Palpitations [ ]   Paroxysmal nocturnal dyspnea[ ]  Claudication [ ]  Edema [ ]   Pulmonary: Cough [ ]  Wheezing[ ]   SOB [ ]   Snoring [ ]   GI: Nausea [ ]  Vomiting[ ]  Dysphagia[ ]  Heartburn[ ]  Abdominal pain [ ]  Constipation [ ] ; Diarrhea [ ] ; BRBPR [ ]  Melena[ ]  GU: Hematuria[ ]  Dysuria [ ]  Nocturia[ ]  Urgency [ ]   Hesitancy [ ]  Discharge [ ]  Neuro: Headaches[ ]  Vertigo[ ]  Paresthesias[ ]  Spasm [ ]  Speech changes [ ]  Incoordination [ ]   Ortho: Arthritis [ ]  Joint pain [ ]  Muscle pain [ ]  Joint swelling [ ]  Back Pain [  ] Skin:  Rash [ ]   Pruritis [ ]  Change in skin lesion [ ]   Psych: Depression[ ]  Anxiety[ ]  Confusion [ ]  Memory loss [ ]   Heme/Lypmh: Bleeding [ ]  Bruising [ ]  Enlarged lymph nodes [ ]   Endocrine: Visual blurring [ ]  Paresthesia [ ]  Polyuria [ ]  Polydypsea [ ]    Heat/cold intolerance [ ]  Hypoglycemia [ ]   Family history- Review and unchanged Social history- Review and unchanged Physical Exam: BP 102/72  Pulse 72  Temp(Src) 97.7 F (36.5 C)  Resp 16  Ht 6' (1.829 m)  Wt 199 lb (90.266 kg)  BMI 26.98 kg/m2 Wt Readings from Last 3 Encounters:  09/30/13 199 lb (90.266 kg)  06/29/13 197 lb (89.359 kg)  04/08/13 198 lb (89.812 kg)   General Appearance: Well nourished, in no apparent distress. Eyes: PERRLA, EOMs, conjunctiva no swelling or erythema Sinuses: No Frontal/maxillary tenderness ENT/Mouth: Ext aud canals clear, TMs without erythema, bulging. No erythema, swelling, or exudate on post pharynx.  Tonsils not swollen or erythematous. Hearing normal.  Neck: Supple, thyroid normal.  Respiratory: Respiratory effort normal, BS equal bilaterally without rales, rhonchi, wheezing or stridor.  Cardio: RRR with no MRGs. Brisk peripheral pulses without edema.  Abdomen: Soft, + BS.  Non tender, no guarding, rebound, hernias, masses. Lymphatics: Non tender without lymphadenopathy.  Musculoskeletal: Full ROM, 5/5 strength, normal gait.  Skin: Warm, dry without rashes, lesions, ecchymosis.  Neuro: Cranial nerves intact. Normal muscle tone, no cerebellar symptoms. Sensation intact.  Psych: Awake and oriented X 3, normal affect, Insight and Judgment appropriate.    Marcus MullingAmanda Kamla Skilton 8:56 AM

## 2013-10-01 LAB — VITAMIN D 25 HYDROXY (VIT D DEFICIENCY, FRACTURES): VIT D 25 HYDROXY: 93 ng/mL — AB (ref 30–89)

## 2013-12-12 ENCOUNTER — Other Ambulatory Visit: Payer: Self-pay | Admitting: *Deleted

## 2013-12-12 MED ORDER — LEVOCETIRIZINE DIHYDROCHLORIDE 5 MG PO TABS: 5.0000 mg | ORAL_TABLET | Freq: Every evening | ORAL | Status: DC

## 2014-01-24 ENCOUNTER — Other Ambulatory Visit: Payer: Self-pay | Admitting: Physician Assistant

## 2014-01-24 MED ORDER — SCOPOLAMINE 1 MG/3DAYS TD PT72: 1.0000 | MEDICATED_PATCH | TRANSDERMAL | Status: DC

## 2014-03-27 ENCOUNTER — Other Ambulatory Visit: Payer: Self-pay | Admitting: *Deleted

## 2014-03-27 MED ORDER — ACYCLOVIR 800 MG PO TABS: 800.0000 mg | ORAL_TABLET | Freq: Four times a day (QID) | ORAL | Status: DC | PRN

## 2014-06-05 ENCOUNTER — Other Ambulatory Visit: Payer: Self-pay | Admitting: Internal Medicine

## 2014-06-30 ENCOUNTER — Encounter: Payer: Self-pay | Admitting: Internal Medicine

## 2014-08-09 ENCOUNTER — Ambulatory Visit (INDEPENDENT_AMBULATORY_CARE_PROVIDER_SITE_OTHER): Payer: BLUE CROSS/BLUE SHIELD | Admitting: Internal Medicine

## 2014-08-09 ENCOUNTER — Encounter: Payer: Self-pay | Admitting: Internal Medicine

## 2014-08-09 VITALS — BP 106/78 | HR 72 | Temp 97.5°F | Resp 16 | Ht 72.0 in | Wt 194.4 lb

## 2014-08-09 DIAGNOSIS — Z125 Encounter for screening for malignant neoplasm of prostate: Secondary | ICD-10-CM

## 2014-08-09 DIAGNOSIS — E782 Mixed hyperlipidemia: Secondary | ICD-10-CM

## 2014-08-09 DIAGNOSIS — R5383 Other fatigue: Secondary | ICD-10-CM

## 2014-08-09 DIAGNOSIS — F988 Other specified behavioral and emotional disorders with onset usually occurring in childhood and adolescence: Secondary | ICD-10-CM

## 2014-08-09 DIAGNOSIS — Z79899 Other long term (current) drug therapy: Secondary | ICD-10-CM

## 2014-08-09 DIAGNOSIS — F909 Attention-deficit hyperactivity disorder, unspecified type: Secondary | ICD-10-CM

## 2014-08-09 DIAGNOSIS — R030 Elevated blood-pressure reading, without diagnosis of hypertension: Secondary | ICD-10-CM

## 2014-08-09 DIAGNOSIS — IMO0001 Reserved for inherently not codable concepts without codable children: Secondary | ICD-10-CM

## 2014-08-09 DIAGNOSIS — Z1211 Encounter for screening for malignant neoplasm of colon: Secondary | ICD-10-CM

## 2014-08-09 DIAGNOSIS — Z111 Encounter for screening for respiratory tuberculosis: Secondary | ICD-10-CM

## 2014-08-09 DIAGNOSIS — R7309 Other abnormal glucose: Secondary | ICD-10-CM

## 2014-08-09 DIAGNOSIS — E559 Vitamin D deficiency, unspecified: Secondary | ICD-10-CM

## 2014-08-09 LAB — CBC WITH DIFFERENTIAL/PLATELET
Basophils Absolute: 0.1 10*3/uL (ref 0.0–0.1)
Basophils Relative: 1 % (ref 0–1)
Eosinophils Absolute: 0.2 10*3/uL (ref 0.0–0.7)
Eosinophils Relative: 3 % (ref 0–5)
HCT: 44 % (ref 39.0–52.0)
HEMOGLOBIN: 15 g/dL (ref 13.0–17.0)
LYMPHS ABS: 2 10*3/uL (ref 0.7–4.0)
LYMPHS PCT: 35 % (ref 12–46)
MCH: 30.9 pg (ref 26.0–34.0)
MCHC: 34.1 g/dL (ref 30.0–36.0)
MCV: 90.5 fL (ref 78.0–100.0)
MPV: 10 fL (ref 8.6–12.4)
Monocytes Absolute: 0.4 10*3/uL (ref 0.1–1.0)
Monocytes Relative: 8 % (ref 3–12)
NEUTROS ABS: 3 10*3/uL (ref 1.7–7.7)
NEUTROS PCT: 53 % (ref 43–77)
Platelets: 221 10*3/uL (ref 150–400)
RBC: 4.86 MIL/uL (ref 4.22–5.81)
RDW: 13 % (ref 11.5–15.5)
WBC: 5.6 10*3/uL (ref 4.0–10.5)

## 2014-08-09 LAB — LIPID PANEL
CHOL/HDL RATIO: 5.1 ratio
Cholesterol: 194 mg/dL (ref 0–200)
HDL: 38 mg/dL — ABNORMAL LOW (ref >=40)
LDL Cholesterol: 122 mg/dL — ABNORMAL HIGH (ref 0–99)
Triglycerides: 171 mg/dL — ABNORMAL HIGH (ref <150)
VLDL: 34 mg/dL (ref 0–40)

## 2014-08-09 LAB — IRON AND TIBC
%SAT: 34 % (ref 20–55)
IRON: 111 ug/dL (ref 42–165)
TIBC: 327 ug/dL (ref 215–435)
UIBC: 216 ug/dL (ref 125–400)

## 2014-08-09 LAB — BASIC METABOLIC PANEL WITH GFR
BUN: 13 mg/dL (ref 6–23)
CALCIUM: 9 mg/dL (ref 8.4–10.5)
CO2: 22 meq/L (ref 19–32)
Chloride: 104 mEq/L (ref 96–112)
Creat: 1.12 mg/dL (ref 0.50–1.35)
GFR, Est Non African American: 81 mL/min
Glucose, Bld: 94 mg/dL (ref 70–99)
Potassium: 4.2 mEq/L (ref 3.5–5.3)
SODIUM: 140 meq/L (ref 135–145)

## 2014-08-09 LAB — VITAMIN B12: Vitamin B-12: 395 pg/mL (ref 211–911)

## 2014-08-09 LAB — HEMOGLOBIN A1C
Hgb A1c MFr Bld: 5.7 % — ABNORMAL HIGH (ref <5.7)
Mean Plasma Glucose: 117 mg/dL — ABNORMAL HIGH (ref <117)

## 2014-08-09 LAB — HEPATIC FUNCTION PANEL
ALK PHOS: 41 U/L (ref 39–117)
ALT: 15 U/L (ref 0–53)
AST: 16 U/L (ref 0–37)
Albumin: 4.1 g/dL (ref 3.5–5.2)
BILIRUBIN DIRECT: 0.1 mg/dL (ref 0.0–0.3)
BILIRUBIN TOTAL: 0.5 mg/dL (ref 0.2–1.2)
Indirect Bilirubin: 0.4 mg/dL (ref 0.2–1.2)
Total Protein: 7 g/dL (ref 6.0–8.3)

## 2014-08-09 LAB — MAGNESIUM: MAGNESIUM: 1.9 mg/dL (ref 1.5–2.5)

## 2014-08-09 LAB — TSH: TSH: 2.174 u[IU]/mL (ref 0.350–4.500)

## 2014-08-09 MED ORDER — AMPHETAMINE-DEXTROAMPHETAMINE 10 MG PO TABS: ORAL_TABLET | ORAL | Status: DC

## 2014-08-09 MED ORDER — SILDENAFIL CITRATE 20 MG PO TABS: ORAL_TABLET | ORAL | Status: DC

## 2014-08-09 NOTE — Progress Notes (Signed)
Patient ID: Marcus Jones, male   DOB: 1971-02-26, 44 y.o.   MRN: [id number] Annual Comprehensive Examination  This very nice 44 y.o. DWM presents for complete physical.  Patient is evaluated and screened for labile HTN, Prediabetes, Hyperlipidemia, and Vitamin D Deficiency. Patient has ADD and ids followed by Dr Evelene Croon for management.    Patient's BP has been controlled at home.Today's BP: 106/78 mmHg. Patient denies any cardiac symptoms as chest pain, palpitations, shortness of breath, dizziness or ankle swelling.   Patient's has hyperlipidemia and has stopped his Pravastatin due to severe limiting myalgias. Patient's myalgias resolved with discontinuance of Pravastatin. Last lipids were not at goal - Total Chol 195; HDL 43; with elevated LDL  120*; Trig 162 on 09/30/2013.   Patient has prediabetes since with elevated A1c's of 5.7% and 5.9% in 2013 and patient denies reactive hypoglycemic symptoms, visual blurring, diabetic polys or paresthesias. Last A1c was 5.4% in Mar 2015.     Finally, patient has history of Vitamin D Deficiency of 39 in 2013 and last vitamin D was 93 on 09/30/2013.  Medication Sig  . acyclovir (ZOVIRAX) 800 MG tablet TAKE 1 TABLET BY MOUTH 4 TIMES DAILY AS NEEDED.  Marland Kitchen amphetamine-dextroamphetamine (ADDERALL XR) 5 MG 24 hr capsule Take 5 mg by mouth every morning. 1-2 qd as needed  . Cholecalciferol (VITAMIN D3) 5000 UNIT/ML LIQD Take by mouth daily.  Marland Kitchen levocetirizine (XYZAL) 5 MG tablet Take 1 tablet (5 mg total) by mouth every evening.  Marland Kitchen PROCTOSOL HC 2.5 % rectal cream APPLY RECTALLY FOUR TIMES DAILY.  . sildenafil (VIAGRA) 100 MG tablet Take 50 mg by mouth daily as needed for erectile dysfunction.  . hydrocortisone 2.5 % cream Apply 1 application topically 2 (two) times daily.   Allergies  Allergen Reactions  . Amoxicillin Anhydrous [Amoxicillin]   . Doxycycline   . Eggs Or Egg-Derived Products   . Keflex [Cephalexin]   . Levaquin [Levofloxacin]   . Neomycin   .  Sulfa Antibiotics    Past Medical History  Diagnosis Date  . ADD (attention deficit disorder)   . Family history of malignant neoplasm of gastrointestinal tract   . Arthritis   . Hyperlipemia   . Allergy    Health Maintenance  Topic Date Due  . TETANUS/TDAP  09/25/1990  . INFLUENZA VACCINE  11/27/2014  . HIV Screening  Completed   Immunization History  Administered Date(s) Administered  . PPD Test 06/29/2013   Past Surgical History  Procedure Laterality Date  . Tonsillectomy    . Wisdom tooth extraction    . Nasal fracture surgery     Family History  Problem Relation Age of Onset  . Colon polyps Mother   . Colon cancer Father     x3  . Clotting disorder Father   . Prostate cancer Brother   . Clotting disorder Brother     History   Social History  . Marital Status: Divorced    Spouse Name: N/A  . Number of Children: 2  . Years of Education: college    Occupational History  . [employer]    Social History Main Topics  . Smoking status: Never Smoker   . Smokeless tobacco: Never Used  . Alcohol Use: Yes     Comment: 1-2 weekl   . Drug Use: No  . Sexual Activity: Not on file   Other Topics Concern  . Not on file   Social History Narrative   Patient lives at home alone.  Patient is divorced.    Patient has a degree.    Patient has 2 children.     ROS Constitutional: Denies fever, chills, weight loss/gain, headaches, insomnia,  night sweats or change in appetite. Does c/o fatigue. Eyes: Denies redness, blurred vision, diplopia, discharge, itchy or watery eyes.  ENT: Denies discharge, congestion, post nasal drip, epistaxis, sore throat, earache, hearing loss, dental pain, Tinnitus, Vertigo, Sinus pain or snoring.  Cardio: Denies chest pain, palpitations, irregular heartbeat, syncope, dyspnea, diaphoresis, orthopnea, PND, claudication or edema Respiratory: denies cough, dyspnea, DOE, pleurisy, hoarseness, laryngitis or wheezing.  Gastrointestinal:  Denies dysphagia, heartburn, reflux, water brash, pain, cramps, nausea, vomiting, bloating, diarrhea, constipation, hematemesis, melena, hematochezia, jaundice or hemorrhoids Genitourinary: Denies dysuria, frequency, urgency, nocturia, hesitancy, discharge, hematuria or flank pain Musculoskeletal: Denies arthralgia, myalgia, stiffness, Jt. Swelling, pain, limp or strain/sprain. Denies Falls. Skin: Denies puritis, rash, hives, warts, acne, eczema or change in skin lesion Neuro: No weakness, tremor, incoordination, spasms, paresthesia or pain Psychiatric: Denies confusion, memory loss or sensory loss. Denies Depression. Endocrine: Denies change in weight, skin, hair change, nocturia, and paresthesia, diabetic polys, visual blurring or hyper / hypo glycemic episodes.  Heme/Lymph: No excessive bleeding, bruising or enlarged lymph nodes.  Physical Exam  BP 106/78 mmHg  Pulse 72  Temp(Src) 97.5 F (36.4 C)  Resp 16  Ht 6' (1.829 m)  Wt 194 lb 6.4 oz (88.179 kg)  BMI 26.36 kg/m2  General Appearance: Well nourished, in no apparent distress. Eyes: PERRLA, EOMs, conjunctiva no swelling or erythema, normal fundi and vessels. Sinuses: No frontal/maxillary tenderness ENT/Mouth: EACs patent / TMs  nl. Nares clear without erythema, swelling, mucoid exudates. Oral hygiene is good. No erythema, swelling, or exudate. Tongue normal, non-obstructing. Tonsils not swollen or erythematous. Hearing normal.  Neck: Supple, thyroid normal. No bruits, nodes or JVD. Respiratory: Respiratory effort normal.  BS equal and clear bilateral without rales, rhonci, wheezing or stridor. Cardio: Heart sounds are normal with regular rate and rhythm and no murmurs, rubs or gallops. Peripheral pulses are normal and equal bilaterally without edema. No aortic or femoral bruits. Chest: symmetric with normal excursions and percussion.  Abdomen: Flat, soft, with bowl sounds. Nontender, no guarding, rebound, hernias, masses, or  organomegaly.  Lymphatics: Non tender without lymphadenopathy.  Genitourinary: No hernias.Testes nl. DRE - prostate nl for age - smooth & firm w/o nodules. Musculoskeletal: Full ROM all peripheral extremities, joint stability, 5/5 strength, and normal gait. Skin: Warm and dry without rashes, lesions, cyanosis, clubbing or  ecchymosis.  Neuro: Cranial nerves intact, reflexes equal bilaterally. Normal muscle tone, no cerebellar symptoms. Sensation intact.  Pysch: Awake and oriented X 3 with normal affect, insight and judgment appropriate.   Assessment and Plan  1. Elevated BP  - EKG 12-Lead  2. Hyperlipidemia  - Lipid panel  - consider Zetia pending lipid results  3. Abnormal glucose  - Hemoglobin A1c - Insulin, random  4. Vitamin D deficiency  - Vit D  25 hydroxy   5. ADD (attention deficit disorder)  - amphetamine-dextroamphetamine (ADDERALL) 10 MG tablet; Take 1 to 1 2 tabs daily as needed for ADD; Refill: 0  6. Other fatigue  - Vitamin B12 - Testosterone - Iron and TIBC - TSH  7. Medication management  - Urine Microscopic - CBC with Differential/Platelet - BASIC METABOLIC PANEL WITH GFR - Hepatic function panel - Magnesium  8. Special screening for malignant neoplasms, colon   9. Screening for prostate cancer   10. Screening examination for pulmonary tuberculosis  -  PPD   Continue prudent diet as discussed, weight control, BP monitoring, regular exercise, and medications as discussed.  Discussed med effects and SE's. Routine screening labs and tests as requested with regular follow-up as recommended. Over 40 minutes of exam, counseling &  chart review was performed

## 2014-08-09 NOTE — Patient Instructions (Signed)

## 2014-08-10 LAB — INSULIN, RANDOM: INSULIN: 8.7 u[IU]/mL (ref 2.0–19.6)

## 2014-08-10 LAB — URINALYSIS, MICROSCOPIC ONLY
BACTERIA UA: NONE SEEN
Casts: NONE SEEN
Crystals: NONE SEEN
Squamous Epithelial / LPF: NONE SEEN

## 2014-08-10 LAB — TESTOSTERONE: Testosterone: 446 ng/dL (ref 300–890)

## 2014-08-10 LAB — VITAMIN D 25 HYDROXY (VIT D DEFICIENCY, FRACTURES): Vit D, 25-Hydroxy: 66 ng/mL (ref 30–100)

## 2014-08-15 LAB — TB SKIN TEST
Induration: 0 mm
TB Skin Test: NEGATIVE

## 2014-12-12 ENCOUNTER — Other Ambulatory Visit: Payer: Self-pay | Admitting: Physician Assistant

## 2015-06-25 ENCOUNTER — Other Ambulatory Visit: Payer: Self-pay | Admitting: Internal Medicine

## 2015-07-20 ENCOUNTER — Encounter: Payer: Self-pay | Admitting: Internal Medicine

## 2015-08-15 ENCOUNTER — Encounter: Payer: Self-pay | Admitting: Internal Medicine

## 2015-08-15 ENCOUNTER — Ambulatory Visit (INDEPENDENT_AMBULATORY_CARE_PROVIDER_SITE_OTHER): Payer: BLUE CROSS/BLUE SHIELD | Admitting: Internal Medicine

## 2015-08-15 VITALS — BP 104/70 | HR 72 | Temp 97.3°F | Resp 16 | Ht 72.0 in | Wt 209.6 lb

## 2015-08-15 DIAGNOSIS — Z125 Encounter for screening for malignant neoplasm of prostate: Secondary | ICD-10-CM

## 2015-08-15 DIAGNOSIS — E559 Vitamin D deficiency, unspecified: Secondary | ICD-10-CM

## 2015-08-15 DIAGNOSIS — Z111 Encounter for screening for respiratory tuberculosis: Secondary | ICD-10-CM

## 2015-08-15 DIAGNOSIS — R030 Elevated blood-pressure reading, without diagnosis of hypertension: Secondary | ICD-10-CM

## 2015-08-15 DIAGNOSIS — E782 Mixed hyperlipidemia: Secondary | ICD-10-CM

## 2015-08-15 DIAGNOSIS — Z1212 Encounter for screening for malignant neoplasm of rectum: Secondary | ICD-10-CM

## 2015-08-15 DIAGNOSIS — Z0000 Encounter for general adult medical examination without abnormal findings: Secondary | ICD-10-CM | POA: Diagnosis not present

## 2015-08-15 DIAGNOSIS — Z79899 Other long term (current) drug therapy: Secondary | ICD-10-CM | POA: Diagnosis not present

## 2015-08-15 DIAGNOSIS — Z136 Encounter for screening for cardiovascular disorders: Secondary | ICD-10-CM | POA: Diagnosis not present

## 2015-08-15 DIAGNOSIS — Z0001 Encounter for general adult medical examination with abnormal findings: Secondary | ICD-10-CM

## 2015-08-15 DIAGNOSIS — I10 Essential (primary) hypertension: Secondary | ICD-10-CM

## 2015-08-15 DIAGNOSIS — R5383 Other fatigue: Secondary | ICD-10-CM

## 2015-08-15 DIAGNOSIS — R7309 Other abnormal glucose: Secondary | ICD-10-CM

## 2015-08-15 DIAGNOSIS — IMO0001 Reserved for inherently not codable concepts without codable children: Secondary | ICD-10-CM

## 2015-08-15 DIAGNOSIS — F988 Other specified behavioral and emotional disorders with onset usually occurring in childhood and adolescence: Secondary | ICD-10-CM

## 2015-08-15 LAB — HEMOGLOBIN A1C
HEMOGLOBIN A1C: 5.4 % (ref <5.7)
MEAN PLASMA GLUCOSE: 108 mg/dL

## 2015-08-15 LAB — HEPATIC FUNCTION PANEL
ALBUMIN: 4.1 g/dL (ref 3.6–5.1)
ALT: 17 U/L (ref 9–46)
AST: 18 U/L (ref 10–40)
Alkaline Phosphatase: 43 U/L (ref 40–115)
BILIRUBIN INDIRECT: 0.3 mg/dL (ref 0.2–1.2)
Bilirubin, Direct: 0.1 mg/dL (ref <=0.2)
TOTAL PROTEIN: 7 g/dL (ref 6.1–8.1)
Total Bilirubin: 0.4 mg/dL (ref 0.2–1.2)

## 2015-08-15 LAB — BASIC METABOLIC PANEL WITH GFR
BUN: 18 mg/dL (ref 7–25)
CALCIUM: 9.2 mg/dL (ref 8.6–10.3)
CO2: 24 mmol/L (ref 20–31)
CREATININE: 1.32 mg/dL (ref 0.60–1.35)
Chloride: 106 mmol/L (ref 98–110)
GFR, EST AFRICAN AMERICAN: 76 mL/min (ref >=60)
GFR, Est Non African American: 66 mL/min (ref >=60)
GLUCOSE: 98 mg/dL (ref 65–99)
Potassium: 4.3 mmol/L (ref 3.5–5.3)
Sodium: 142 mmol/L (ref 135–146)

## 2015-08-15 LAB — TSH: TSH: 1.86 mIU/L (ref 0.40–4.50)

## 2015-08-15 LAB — IRON AND TIBC
%SAT: 25 % (ref 15–60)
Iron: 79 ug/dL (ref 50–180)
TIBC: 321 ug/dL (ref 250–425)
UIBC: 242 ug/dL (ref 125–400)

## 2015-08-15 LAB — CBC WITH DIFFERENTIAL/PLATELET
BASOS ABS: 0 {cells}/uL (ref 0–200)
Basophils Relative: 0 %
Eosinophils Absolute: 348 cells/uL (ref 15–500)
Eosinophils Relative: 6 %
HEMATOCRIT: 41.8 % (ref 38.5–50.0)
HEMOGLOBIN: 14.4 g/dL (ref 13.2–17.1)
LYMPHS ABS: 1914 {cells}/uL (ref 850–3900)
LYMPHS PCT: 33 %
MCH: 31.6 pg (ref 27.0–33.0)
MCHC: 34.4 g/dL (ref 32.0–36.0)
MCV: 91.9 fL (ref 80.0–100.0)
MONO ABS: 406 {cells}/uL (ref 200–950)
MPV: 9.7 fL (ref 7.5–12.5)
Monocytes Relative: 7 %
NEUTROS PCT: 54 %
Neutro Abs: 3132 cells/uL (ref 1500–7800)
Platelets: 209 10*3/uL (ref 140–400)
RBC: 4.55 MIL/uL (ref 4.20–5.80)
RDW: 13 % (ref 11.0–15.0)
WBC: 5.8 10*3/uL (ref 3.8–10.8)

## 2015-08-15 LAB — LIPID PANEL
CHOLESTEROL: 202 mg/dL — AB (ref 125–200)
HDL: 39 mg/dL — ABNORMAL LOW (ref >=40)
LDL Cholesterol: 142 mg/dL — ABNORMAL HIGH (ref <130)
TRIGLYCERIDES: 107 mg/dL (ref <150)
Total CHOL/HDL Ratio: 5.2 Ratio — ABNORMAL HIGH (ref <=5.0)
VLDL: 21 mg/dL (ref <30)

## 2015-08-15 LAB — MAGNESIUM: MAGNESIUM: 2.1 mg/dL (ref 1.5–2.5)

## 2015-08-15 LAB — VITAMIN B12: VITAMIN B 12: 698 pg/mL (ref 200–1100)

## 2015-08-15 MED ORDER — CYCLOBENZAPRINE HCL 10 MG PO TABS: ORAL_TABLET | ORAL | Status: AC

## 2015-08-15 NOTE — Patient Instructions (Signed)

## 2015-08-15 NOTE — Progress Notes (Signed)
Patient ID: Marcus Jones, male   DOB: 12/30/1970, 45 y.o.   MRN: [id number]  Annual  Screening/Preventative Visit And Comprehensive Evaluation & Examination     This very nice 45 y.o.male presents for a Wellness/Preventative Visit & comprehensive evaluation and management of multiple medical co-morbidities.  Patient has been followed for labile HTN, Prediabetes, Hyperlipidemia and Vitamin D Deficiency. In 2008, patient had a sleep study showing "mild apnea".  Patient is followed by Dr Evelene Croon for ADD and takes Adderall & Adderall-xr sporadically to avoid dependence.      Patient is followed expectantly for labile HTN & his BP's have been controlled at home.Today's BP: 104/70 mmHg. Patient denies any cardiac symptoms as chest pain, palpitations, shortness of breath, dizziness or ankle swelling.     Patient's hyperlipidemia is not controlled with diet and he is intolerant to Pravastatin. Patient denies myalgias or other medication SE's. Last lipids were not at goal with T Chol 194, HDL 38, TG 171 and elevated LDL 122 in Apr 2016.       Patient has prediabetes with A1c 5.7% and 5.9% in 2013  and patient denies reactive hypoglycemic symptoms, visual blurring, diabetic polys or paresthesias. Last A1c was 5.7% in Apr 2016.     Finally, patient has history of Vitamin D Deficiency of "26" in 2013 and last vitamin D was better  66 in Apr 2016.  Medication Sig  . Acyclovir 800 MG tablet TAKE 1 TABLET BY MOUTH 4 TIMES DAILY AS NEEDED.  Marland Kitchen ADDERALL XR 15 MG 24 hr capsule   . ADDERALL 10 MG tablet Take 1 to 1 2 tabs daily as needed for ADD  . VITAMIN D 5000 UNIT/ML LIQD Take by mouth daily.  Marland Kitchen levocetirizine  5 MG tablet TAKE ONE TABLET BY MOUTH ONCE DAILY.  Marland Kitchen PROCTOSOL HC 2.5 % rec crm APPLY RECTALLY FOUR TIMES DAILY.  . sildenafil  20 MG tablet Take 2 to 5 tablets daily as needed for XXXX   Allergies  Allergen Reactions  . Amoxicillin Anhydrous [Amoxicillin]   . Doxycycline   . Eggs Or  Egg-Derived Products   . Keflex [Cephalexin]   . Levaquin [Levofloxacin]   . Neomycin   . Sulfa Antibiotics    Past Medical History  Diagnosis Date  . ADD (attention deficit disorder)   . Family history of malignant neoplasm of gastrointestinal tract   . Arthritis   . Hyperlipemia   . Allergy    Health Maintenance  Topic Date Due  . TETANUS/TDAP  09/25/1990  . INFLUENZA VACCINE  11/27/2015  . HIV Screening  Completed   Immunization History  Administered Date(s) Administered  . PPD Test 06/29/2013, 08/09/2014, 08/15/2015  . Td 04/28/2008   Past Surgical History  Procedure Laterality Date  . Tonsillectomy    . Wisdom tooth extraction    . Nasal fracture surgery     Family History  Problem Relation Age of Onset  . Colon polyps Mother   . Colon cancer Father     x3  . Clotting disorder Father   . Prostate cancer Brother   . Clotting disorder Brother     Social History   Social History  . Marital Status: Divorced    Spouse Name: N/A  . Number of Children: 2  . Years of Education: college    Occupational History  . [employer]    Social History Main Topics  . Smoking status: Never Smoker   . Smokeless tobacco: Never Used  . Alcohol Use:  Yes     Comment: 1-2 weekl   . Drug Use: No  . Sexual Activity: Not on file   Other Topics Concern  . Not on file   Social History Narrative   Patient lives at home alone.    Patient is divorced.    Patient has a degree.    Patient has 2 children.     ROS Constitutional: Denies fever, chills, weight loss/gain, headaches, insomnia,  night sweats or change in appetite. Does c/o fatigue. Eyes: Denies redness, blurred vision, diplopia, discharge, itchy or watery eyes.  ENT: Denies discharge, congestion, post nasal drip, epistaxis, sore throat, earache, hearing loss, dental pain, Tinnitus, Vertigo, Sinus pain or snoring.  Cardio: Denies chest pain, palpitations, irregular heartbeat, syncope, dyspnea, diaphoresis,  orthopnea, PND, claudication or edema Respiratory: denies cough, dyspnea, DOE, pleurisy, hoarseness, laryngitis or wheezing.  Gastrointestinal: Denies dysphagia, heartburn, reflux, water brash, pain, cramps, nausea, vomiting, bloating, diarrhea, constipation, hematemesis, melena, hematochezia, jaundice or hemorrhoids Genitourinary: Denies dysuria, frequency, urgency, nocturia, hesitancy, discharge, hematuria or flank pain Musculoskeletal: Denies arthralgia, myalgia, stiffness, Jt. Swelling, pain, limp or strain/sprain. Denies Falls. Skin: Denies puritis, rash, hives, warts, acne, eczema or change in skin lesion Neuro: No weakness, tremor, incoordination, spasms, paresthesia or pain Psychiatric: Denies confusion, memory loss or sensory loss. Denies Depression. Endocrine: Denies change in weight, skin, hair change, nocturia, and paresthesia, diabetic polys, visual blurring or hyper / hypo glycemic episodes.  Heme/Lymph: No excessive bleeding, bruising or enlarged lymph nodes.  Physical Exam  BP 104/70 mmHg  Pulse 72  Temp(Src) 97.3 F (36.3 C)  Resp 16  Ht 6' (1.829 m)  Wt 209 lb 9.6 oz (95.074 kg)  BMI 28.42 kg/m2  General Appearance: Well nourished, in no apparent distress. Eyes: PERRLA, EOMs, conjunctiva no swelling or erythema, normal fundi and vessels. Sinuses: No frontal/maxillary tenderness ENT/Mouth: EACs patent / TMs  nl. Nares clear without erythema, swelling, mucoid exudates. Oral hygiene is good. No erythema, swelling, or exudate. Tongue normal, non-obstructing. Tonsils not swollen or erythematous. Hearing normal.  Neck: Supple, thyroid normal. No bruits, nodes or JVD. Respiratory: Respiratory effort normal.  BS equal and clear bilateral without rales, rhonci, wheezing or stridor. Cardio: Heart sounds are normal with regular rate and rhythm and no murmurs, rubs or gallops. Peripheral pulses are normal and equal bilaterally without edema. No aortic or femoral bruits. Chest:  symmetric with normal excursions and percussion.  Abdomen: Soft, with Nl bowel sounds. Nontender, no guarding, rebound, hernias, masses, or organomegaly.  Lymphatics: Non tender without lymphadenopathy.  Genitourinary: No hernias.Testes nl. DRE - prostate nl for age - smooth & firm w/o nodules. Musculoskeletal: Full ROM all peripheral extremities, joint stability, 5/5 strength, and normal gait. Skin: Warm and dry without rashes, lesions, cyanosis, clubbing or  ecchymosis.  Neuro: Cranial nerves intact, reflexes equal bilaterally. Normal muscle tone, no cerebellar symptoms. Sensation intact.  Pysch: Alert and oriented X 3 with normal affect, insight and judgment appropriate.   Assessment and Plan  1. Annual Preventative/Screening Exam   - Microalbumin / creatinine urine ratio - EKG 12-Lead - POC Hemoccult Bld/Stl  - Urinalysis, Routine w reflex microscopic  - Vitamin B12 - Iron and TIBC - PSA - Testosterone - CBC with Differential/Platelet - BASIC METABOLIC PANEL WITH GFR - Hepatic function panel - Magnesium - Lipid panel - TSH - Hemoglobin A1c - Insulin, random - VITAMIN D 25 Hydroxy   2. Elevated BP  - EKG 12-Lead - TSH  3. Hyperlipidemia  - Lipid panel -  TSH  4. Abnormal glucose  - Hemoglobin A1c - Insulin, random  5. Vitamin D deficiency  - VITAMIN D 25 Hydroxy   6. ADD (attention deficit disorder)   7. Screening for rectal cancer  - POC Hemoccult Bld/Stl   8. Prostate cancer screening  - PSA  9. Other fatigue  - Vitamin B12 - Iron and TIBC - Testosterone - CBC with Differential/Platelet  10. Medication management  - Microalbumin / creatinine urine ratio - Urinalysis, Routine w reflex microscopic  - CBC with Differential/Platelet - BASIC METABOLIC PANEL WITH GFR - Hepatic function panel - Magnesium   Continue prudent diet as discussed, weight control, BP monitoring, regular exercise, and medications as discussed.  Discussed med effects  and SE's. Routine screening labs and tests as requested with regular follow-up as recommended. Over 40 minutes of exam, counseling, chart review and high complex critical decision making was performed

## 2015-08-16 LAB — URINALYSIS, ROUTINE W REFLEX MICROSCOPIC
BILIRUBIN URINE: NEGATIVE
GLUCOSE, UA: NEGATIVE
Hgb urine dipstick: NEGATIVE
Ketones, ur: NEGATIVE
LEUKOCYTES UA: NEGATIVE
Nitrite: NEGATIVE
PROTEIN: NEGATIVE
SPECIFIC GRAVITY, URINE: 1.026 (ref 1.001–1.035)
pH: 6 (ref 5.0–8.0)

## 2015-08-16 LAB — INSULIN, RANDOM: INSULIN: 8.8 u[IU]/mL (ref 2.0–19.6)

## 2015-08-16 LAB — PSA: PSA: 0.33 ng/mL (ref <=4.00)

## 2015-08-16 LAB — TESTOSTERONE: Testosterone: 488 ng/dL (ref 250–827)

## 2015-08-16 LAB — MICROALBUMIN / CREATININE URINE RATIO
Creatinine, Urine: 222 mg/dL (ref 20–370)
MICROALB/CREAT RATIO: 2 ug/mg{creat} (ref <30)
Microalb, Ur: 0.5 mg/dL

## 2015-08-16 LAB — VITAMIN D 25 HYDROXY (VIT D DEFICIENCY, FRACTURES): Vit D, 25-Hydroxy: 62 ng/mL (ref 30–100)

## 2015-08-17 LAB — TB SKIN TEST
Induration: 0 mm
TB Skin Test: NEGATIVE

## 2015-12-06 ENCOUNTER — Other Ambulatory Visit: Payer: Self-pay | Admitting: Internal Medicine

## 2016-02-11 ENCOUNTER — Ambulatory Visit (INDEPENDENT_AMBULATORY_CARE_PROVIDER_SITE_OTHER): Payer: BLUE CROSS/BLUE SHIELD | Admitting: Orthopaedic Surgery

## 2016-02-11 DIAGNOSIS — M25571 Pain in right ankle and joints of right foot: Secondary | ICD-10-CM | POA: Diagnosis not present

## 2016-02-25 ENCOUNTER — Telehealth: Payer: Self-pay | Admitting: *Deleted

## 2016-02-25 NOTE — Telephone Encounter (Signed)
Patient called and was concerned about his kidney functions in regard to a recent RX from his orthopedist for Naproxen 1000 mg daily for 10 days. He is now taking 440 mg daily.  Per Dr Oneta RackMcKeown, the 1000 mg dosage was OK since the patient does not have kidney disease and the dose is OK up to 1200 mg to 1500 mg daily.

## 2016-02-26 ENCOUNTER — Telehealth (INDEPENDENT_AMBULATORY_CARE_PROVIDER_SITE_OTHER): Payer: Self-pay | Admitting: Orthopaedic Surgery

## 2016-02-26 NOTE — Telephone Encounter (Signed)
Please advise 

## 2016-02-26 NOTE — Telephone Encounter (Signed)
Called patient no answer, if pt calls back please advise

## 2016-02-26 NOTE — Telephone Encounter (Signed)
Patient called LM triage that he is having increased pain /swelling after his PT visit yesterday.  Wants to know if he needs to come in?  Maybe increase NSAID?  He was doing fine with PT prior to yesterday, Please call him to discuss, thanks.

## 2016-02-26 NOTE — Telephone Encounter (Signed)
Increase nsaid, rest, ice, elevate.  If not better, should come in to se eus

## 2016-02-27 NOTE — Telephone Encounter (Signed)
Called pt to advise on Dr Warren Danesxu's message.

## 2016-03-04 ENCOUNTER — Encounter (INDEPENDENT_AMBULATORY_CARE_PROVIDER_SITE_OTHER): Payer: Self-pay | Admitting: Orthopaedic Surgery

## 2016-03-04 ENCOUNTER — Ambulatory Visit (INDEPENDENT_AMBULATORY_CARE_PROVIDER_SITE_OTHER): Payer: Self-pay

## 2016-03-04 ENCOUNTER — Ambulatory Visit (INDEPENDENT_AMBULATORY_CARE_PROVIDER_SITE_OTHER): Payer: BLUE CROSS/BLUE SHIELD | Admitting: Orthopaedic Surgery

## 2016-03-04 DIAGNOSIS — S93411A Sprain of calcaneofibular ligament of right ankle, initial encounter: Secondary | ICD-10-CM

## 2016-03-04 NOTE — Progress Notes (Signed)
   Office Visit Note   Patient: Marcus CrumblyChristopher W Pendergraft           Date of Birth: 10/07/1970           MRN: [id number] Visit Date: 03/04/2016              Requested by: Lucky CowboyWilliam McKeown, MD [address] [place], [place] [address] PCP: Nadean CorwinMCKEOWN,WILLIAM DAVID, MD   Assessment & Plan: Visit Diagnoses:  1. Sprain of calcaneofibular ligament of right ankle, initial encounter     Plan:  - likely flare up from PT - out of PT for 1 week - CAM for 1 week - ice, elevate, rest, nsaids  Follow-Up Instructions: Return for as scheduled.   Orders:  Orders Placed This Encounter  Procedures  . XR Ankle Complete Right   No orders of the defined types were placed in this encounter.     Procedures: No procedures performed   Clinical Data: No additional findings.   Subjective: Chief Complaint  Patient presents with  . Right Ankle - Pain, Follow-up    Work in today for acute worsening of pain in right ankle after PT last week.  Endorses swelling, shooting pain, tingling, nubness, burning.  Pain is 3-5/10.  Pain worse with weight bearing.     Review of Systems  Constitutional: Negative.      Objective: Vital Signs: There were no vitals taken for this visit.  Physical Exam  Constitutional: He appears well-developed and well-nourished.  Nursing note and vitals reviewed.   Right Ankle Exam  Swelling: mild  Range of Motion  The patient has normal right ankle ROM.  Muscle Strength  The patient has normal right ankle strength.  Tests  Anterior drawer: negative Varus tilt: negative  Other  Sensation: normal Pulse: present   Comments:  Mild ttp to CFL and peroneals.  No subluxation.  Relatively benign exam.      Specialty Comments:  No specialty comments available.  Imaging: Xr Ankle Complete Right  Result Date: 03/04/2016 Negative for acute findings.    PMFS History: Patient Active Problem List   Diagnosis Date Noted  . Sprain of  calcaneofibular ligament of right ankle 03/04/2016  . Elevated BP 08/09/2014  . Abnormal glucose 08/09/2014  . Medication management 08/09/2014  . Vitamin D deficiency 08/09/2014  . Hyperlipidemia 06/29/2013  . ADD (attention deficit disorder) 06/29/2013  . Paresthesias 02/02/2013   Past Medical History:  Diagnosis Date  . ADD (attention deficit disorder)   . Allergy   . Arthritis   . Family history of malignant neoplasm of gastrointestinal tract   . Hyperlipemia     Family History  Problem Relation Age of Onset  . Colon polyps Mother   . Colon cancer Father     x3  . Clotting disorder Father   . Prostate cancer Brother   . Clotting disorder Brother     Past Surgical History:  Procedure Laterality Date  . NASAL FRACTURE SURGERY    . TONSILLECTOMY    . WISDOM TOOTH EXTRACTION     Social History   Occupational History  . [employer]   Social History Main Topics  . Smoking status: Never Smoker  . Smokeless tobacco: Never Used  . Alcohol use Yes     Comment: 1-2 weekl   . Drug use: No  . Sexual activity: Not on file

## 2016-03-24 ENCOUNTER — Encounter (INDEPENDENT_AMBULATORY_CARE_PROVIDER_SITE_OTHER): Payer: Self-pay | Admitting: Orthopaedic Surgery

## 2016-03-24 ENCOUNTER — Ambulatory Visit (INDEPENDENT_AMBULATORY_CARE_PROVIDER_SITE_OTHER): Payer: BLUE CROSS/BLUE SHIELD | Admitting: Orthopaedic Surgery

## 2016-03-24 DIAGNOSIS — S93411D Sprain of calcaneofibular ligament of right ankle, subsequent encounter: Secondary | ICD-10-CM | POA: Diagnosis not present

## 2016-03-24 DIAGNOSIS — M7541 Impingement syndrome of right shoulder: Secondary | ICD-10-CM

## 2016-03-24 DIAGNOSIS — S93411A Sprain of calcaneofibular ligament of right ankle, initial encounter: Secondary | ICD-10-CM

## 2016-03-24 MED ORDER — DICLOFENAC SODIUM 1 % TD GEL: 2.0000 g | Freq: Four times a day (QID) | TRANSDERMAL | 5 refills | Status: DC

## 2016-03-24 NOTE — Progress Notes (Signed)
Office Visit Note   Patient: Marcus CrumblyChristopher W Jones           Date of Birth: Apr 05, 1971           MRN: [id number] Visit Date: 03/24/2016              Requested by: Lucky CowboyWilliam McKeown, MD [address] [place], [place] [address] PCP: Nadean CorwinMCKEOWN,WILLIAM DAVID, MD   Assessment & Plan: Visit Diagnoses:  1. Sprain of calcaneofibular ligament of right ankle, initial encounter   2. Impingement syndrome of right shoulder     Plan: For ankle standpoint I recommend getting back into physical therapy. I think he is quite anxious about this but I feel that he is improving. For the right shoulder he is likely having some impingement versus subacromial bursitis. A subacromial injection was performed today under sterile conditions patient tolerated this well and I will see him back after physical therapy for the ankle.  Follow-Up Instructions: Return if symptoms worsen or fail to improve.   Orders:  No orders of the defined types were placed in this encounter.  Meds ordered this encounter  Medications  . diclofenac sodium (VOLTAREN) 1 % GEL    Sig: Apply 2 g topically 4 (four) times daily.    Dispense:  1 Tube    Refill:  5      Procedures: Large Joint Inj Date/Time: 03/24/2016 1:04 PM Performed by: Tarry KosXU, NAIPING M Authorized by: Tarry KosXU, NAIPING M   Consent Given by:  Patient Timeout: prior to procedure the correct patient, procedure, and site was verified   Indications:  Pain Location:  Shoulder Site:  R subacromial bursa Prep: patient was prepped and draped in usual sterile fashion   Needle Size:  22 G Approach:  Posterior Ultrasound Guidance: No   Fluoroscopic Guidance: No       Clinical Data: No additional findings.   Subjective: Chief Complaint  Patient presents with  . Right Ankle - Pain, Follow-up  . Right Shoulder - Pain    HPI The patient follows up today for his right ankle sprain. He still has some swelling and pain and numbness. He has been using  Voltaren gel and Aleve. He is also complaining of some right shoulder pain is worse with elevation of the arm. Denies any radiation of the pain. He's been taking naproxen with partial relief. Review of Systems Complete review of systems negative except for history of present illness  Objective: Vital Signs: There were no vitals taken for this visit.  Physical Exam Well-developed well-nourished distress alert 3 Ortho Exam Exam of the right ankle shows no significant changes. Mild discomfort with palpation of the lateral ankle ligaments. Next  Exam of the right shoulder shows an intact rotator cuff exam. He has no pain with empty can testing. He has positive Hawkins impingement. Negative cross adduction. Specialty Comments:  No specialty comments available.  Imaging: No results found.   PMFS History: Patient Active Problem List   Diagnosis Date Noted  . Impingement syndrome of right shoulder 03/24/2016  . Sprain of calcaneofibular ligament of right ankle 03/04/2016  . Elevated BP 08/09/2014  . Abnormal glucose 08/09/2014  . Medication management 08/09/2014  . Vitamin D deficiency 08/09/2014  . Hyperlipidemia 06/29/2013  . ADD (attention deficit disorder) 06/29/2013  . Paresthesias 02/02/2013   Past Medical History:  Diagnosis Date  . ADD (attention deficit disorder)   . Allergy   . Arthritis   . Family history of malignant neoplasm of gastrointestinal  tract   . Hyperlipemia     Family History  Problem Relation Age of Onset  . Colon polyps Mother   . Colon cancer Father     x3  . Clotting disorder Father   . Prostate cancer Brother   . Clotting disorder Brother     Past Surgical History:  Procedure Laterality Date  . NASAL FRACTURE SURGERY    . TONSILLECTOMY    . WISDOM TOOTH EXTRACTION     Social History   Occupational History  . [employer]   Social History Main Topics  . Smoking status: Never Smoker  . Smokeless tobacco: Never Used  .  Alcohol use Yes     Comment: 1-2 weekl   . Drug use: No  . Sexual activity: Not on file

## 2016-07-29 ENCOUNTER — Encounter: Payer: Self-pay | Admitting: Physician Assistant

## 2016-07-29 ENCOUNTER — Ambulatory Visit (INDEPENDENT_AMBULATORY_CARE_PROVIDER_SITE_OTHER): Payer: BLUE CROSS/BLUE SHIELD | Admitting: Physician Assistant

## 2016-07-29 VITALS — BP 122/88 | HR 100 | Temp 97.9°F | Resp 16 | Ht 72.0 in | Wt 215.6 lb

## 2016-07-29 DIAGNOSIS — H9191 Unspecified hearing loss, right ear: Secondary | ICD-10-CM | POA: Diagnosis not present

## 2016-07-29 DIAGNOSIS — H9311 Tinnitus, right ear: Secondary | ICD-10-CM

## 2016-07-29 MED ORDER — PREDNISONE 20 MG PO TABS: ORAL_TABLET | ORAL | 0 refills | Status: AC

## 2016-07-29 NOTE — Progress Notes (Signed)
Subjective:    Patient ID: Marcus Jones, male    Marcus [id number] y.o.   MRN: [id number]  HPI 46 y.o. WM presents with tinnitus in right ear x Sunday.  States he woke up Sunday morning with ringing in his ear, put OTC in his ear that helped decrease the ringing but he is still having decreased hearing, ear fullness, "swooshing/helicopter noises/ocean noises" in his right ear. Takes zyrtec daily. Denies fever, chills, sinus issues, dizziness, changes in vision. Was on NSAIDS for broken ankle, has been off x 2 months. Right air decreased air conduction, good bone conduction.   Blood pressure 122/88, pulse 100, temperature 97.9 F (36.6 C), resp. rate 16, height 6' (1.829 m), weight 215 lb 9.6 oz (97.8 kg), SpO2 96 %.  Medications Current Outpatient Prescriptions on File Prior to Visit  Medication Sig  . acyclovir (ZOVIRAX) 800 MG tablet TAKE 1 TABLET BY MOUTH 4 TIMES DAILY AS NEEDED.  Marland Kitchen amphetamine-dextroamphetamine (ADDERALL XR) 15 MG 24 hr capsule   . amphetamine-dextroamphetamine (ADDERALL) 10 MG tablet Take 1 to 1 2 tabs daily as needed for ADD  . Cholecalciferol (VITAMIN D3) 5000 UNIT/ML LIQD Take by mouth daily.  Marland Kitchen PROCTOSOL HC 2.5 % rectal cream APPLY RECTALLY FOUR TIMES DAILY.  . sildenafil (REVATIO) 20 MG tablet TAKE 2 TO 5 TABLETS BY MOUTH DAILY AS NEEDED   No current facility-administered medications on file prior to visit.     Problem list He has Paresthesias; Hyperlipidemia; ADD (attention deficit disorder); Elevated BP; Abnormal glucose; Medication management; Vitamin D deficiency; Sprain of calcaneofibular ligament of right ankle; and Impingement syndrome of right shoulder on his problem list.   Review of Systems  Constitutional: Negative.  Negative for chills, fatigue and fever.  HENT: Positive for hearing loss and tinnitus. Negative for congestion, dental problem, drooling, ear discharge, ear pain, facial swelling, mouth sores, nosebleeds, postnasal drip,  rhinorrhea, sinus pain, sinus pressure, sneezing, sore throat, trouble swallowing and voice change.   Respiratory: Negative.   Cardiovascular: Negative.   Neurological: Negative.  Negative for dizziness and headaches.       Objective:   Physical Exam  Constitutional: He is oriented to person, place, and time. He appears well-developed and well-nourished.  HENT:  Head: Normocephalic and atraumatic.  Right Ear: External ear normal. No mastoid tenderness. Tympanic membrane is not perforated, not erythematous, not retracted and not bulging. A middle ear effusion is present. Decreased hearing is noted.  Left Ear: External ear normal. No mastoid tenderness. Tympanic membrane is not perforated, not erythematous, not retracted and not bulging. A middle ear effusion is present.  Mouth/Throat: Oropharynx is clear and moist.  Eyes: Conjunctivae and EOM are normal. Pupils are equal, round, and reactive to light.  Neck: Normal range of motion. Neck supple.  Cardiovascular: Normal rate, regular rhythm and normal heart sounds.   Pulmonary/Chest: Effort normal and breath sounds normal.  Abdominal: Soft. Bowel sounds are normal.  Musculoskeletal: Normal range of motion.  Neurological: He is alert and oriented to person, place, and time. No cranial nerve deficit.  Skin: Skin is warm and dry.  Psychiatric: He has a normal mood and affect. His behavior is normal.       Assessment & Plan:  1. Tinnitus of right ear with decreased hearing ? Labyrinthitis, effusion, will send to ENT since affecting his ear - predniSONE (DELTASONE) 20 MG tablet; 1 pill 3 x a day for 3 days, 1 pill 2 x a day x 3 days,  1 pill a day x 5 days with food  Dispense: 20 tablet; Refill: 0 - Ambulatory referral to ENT

## 2016-07-29 NOTE — Patient Instructions (Signed)
Make sure you are on an allergy pill, see below for more details. Please take the prednisone as directed below, this is NOT an antibiotic so you do NOT have to finish it. You can take it for a few days and stop it if you are doing better.   Please take the prednisone to help decrease inflammation and therefore decrease symptoms. Take it it with food to avoid GI upset. It can cause increased energy but on the other hand it can make it hard to sleep at night so please take it AT NIGHT WITH DINNER, it takes 8-12 hours to start working so it will NOT affect your sleeping if you take it at night with your food!!  If you are diabetic it will increase your sugars so decrease carbs and monitor your sugars closely.     Tinnitus Tinnitus refers to hearing a sound when there is no actual source for that sound. This is often described as ringing in the ears. However, people with this condition may hear a variety of noises. A person may hear the sound in one ear or in both ears. The sounds of tinnitus can be soft, loud, or somewhere in between. Tinnitus can last for a few seconds or can be constant for days. It may go away without treatment and come back at various times. When tinnitus is constant or happens often, it can lead to other problems, such as trouble sleeping and trouble concentrating. Almost everyone experiences tinnitus at some point. Tinnitus that is long-lasting (chronic) or comes back often is a problem that may require medical attention. What are the causes? The cause of tinnitus is often not known. In some cases, it can result from other problems or conditions, including:  Exposure to loud noises from machinery, music, or other sources.  Hearing loss.  Ear or sinus infections.  Earwax buildup.  A foreign object in the ear.  Use of certain medicines.  Use of alcohol and caffeine.  High blood pressure.  Heart diseases.  Anemia.  Allergies.  Meniere disease.  Thyroid  problems.  Tumors.  An enlarged part of a weakened blood vessel (aneurysm). What are the signs or symptoms? The main symptom of tinnitus is hearing a sound when there is no source for that sound. It may sound like:  Buzzing.  Roaring.  Ringing.  Blowing air, similar to the sound heard when you listen to a seashell.  Hissing.  Whistling.  Sizzling.  Humming.  Running water.  A sustained musical note. How is this diagnosed? Tinnitus is diagnosed based on your symptoms. Your health care provider will do a physical exam. A comprehensive hearing exam (audiologic exam) will be done if your tinnitus:  Affects only one ear (unilateral).  Causes hearing difficulties.  Lasts 6 months or longer. You may also need to see a health care provider who specializes in hearing disorders (audiologist). You may be asked to complete a questionnaire to determine the severity of your tinnitus. Tests may be done to help determine the cause and to rule out other conditions. These can include:  Imaging studies of your head and brain, such as:  A CT scan.  An MRI.  An imaging study of your blood vessels (angiogram). How is this treated? Treating an underlying medical condition can sometimes make tinnitus go away. If your tinnitus continues, other treatments may include:  Medicines, such as certain antidepressants or sleeping aids.  Sound generators to mask the tinnitus. These include:  Tabletop sound machines that  play relaxing sounds to help you fall asleep.  Wearable devices that fit in your ear and play sounds or music.  A small device that uses headphones to deliver a signal embedded in music (acoustic neural stimulation). In time, this may change the pathways of your brain and make you less sensitive to tinnitus. This device is used for very severe cases when no other treatment is working.  Therapy and counseling to help you manage the stress of living with tinnitus.  Using  hearing aids or cochlear implants, if your tinnitus is related to hearing loss. Follow these instructions at home:  When possible, avoid being in loud places and being exposed to loud sounds.  Wear hearing protection, such as earplugs, when you are exposed to loud noises.  Do not take stimulants, such as nicotine, alcohol, or caffeine.  Practice techniques for reducing stress, such as meditation, yoga, or deep breathing.  Use a white noise machine, a humidifier, or other devices to mask the sound of tinnitus.  Sleep with your head slightly raised. This may reduce the impact of tinnitus.  Try to get plenty of rest each night. Contact a health care provider if:  You have tinnitus in just one ear.  Your tinnitus continues for 3 weeks or longer without stopping.  Home care measures are not helping.  You have tinnitus after a head injury.  You have tinnitus along with any of the following:  Dizziness.  Loss of balance.  Nausea and vomiting. This information is not intended to replace advice given to you by your health care provider. Make sure you discuss any questions you have with your health care provider. Document Released: 04/14/2005 Document Revised: 12/16/2015 Document Reviewed: 09/14/2013 Elsevier Interactive Patient Education  2017 [employer].

## 2016-08-15 ENCOUNTER — Other Ambulatory Visit: Payer: Self-pay | Admitting: Physician Assistant

## 2016-09-02 ENCOUNTER — Encounter: Payer: Self-pay | Admitting: Internal Medicine

## 2016-09-02 ENCOUNTER — Ambulatory Visit (INDEPENDENT_AMBULATORY_CARE_PROVIDER_SITE_OTHER): Payer: BLUE CROSS/BLUE SHIELD | Admitting: Internal Medicine

## 2016-09-02 VITALS — BP 124/90 | HR 76 | Temp 97.3°F | Resp 16 | Ht 72.0 in | Wt 212.2 lb

## 2016-09-02 DIAGNOSIS — Z79899 Other long term (current) drug therapy: Secondary | ICD-10-CM

## 2016-09-02 DIAGNOSIS — R7309 Other abnormal glucose: Secondary | ICD-10-CM

## 2016-09-02 DIAGNOSIS — R030 Elevated blood-pressure reading, without diagnosis of hypertension: Secondary | ICD-10-CM

## 2016-09-02 DIAGNOSIS — Z136 Encounter for screening for cardiovascular disorders: Secondary | ICD-10-CM | POA: Diagnosis not present

## 2016-09-02 DIAGNOSIS — Z111 Encounter for screening for respiratory tuberculosis: Secondary | ICD-10-CM | POA: Diagnosis not present

## 2016-09-02 DIAGNOSIS — E559 Vitamin D deficiency, unspecified: Secondary | ICD-10-CM | POA: Diagnosis not present

## 2016-09-02 DIAGNOSIS — R5383 Other fatigue: Secondary | ICD-10-CM

## 2016-09-02 DIAGNOSIS — Z1212 Encounter for screening for malignant neoplasm of rectum: Secondary | ICD-10-CM

## 2016-09-02 DIAGNOSIS — Z0000 Encounter for general adult medical examination without abnormal findings: Secondary | ICD-10-CM

## 2016-09-02 DIAGNOSIS — F988 Other specified behavioral and emotional disorders with onset usually occurring in childhood and adolescence: Secondary | ICD-10-CM

## 2016-09-02 DIAGNOSIS — E782 Mixed hyperlipidemia: Secondary | ICD-10-CM

## 2016-09-02 DIAGNOSIS — Z125 Encounter for screening for malignant neoplasm of prostate: Secondary | ICD-10-CM | POA: Diagnosis not present

## 2016-09-02 DIAGNOSIS — Z0001 Encounter for general adult medical examination with abnormal findings: Secondary | ICD-10-CM

## 2016-09-02 LAB — CBC WITH DIFFERENTIAL/PLATELET
BASOS PCT: 1 %
Basophils Absolute: 55 cells/uL (ref 0–200)
EOS PCT: 6 %
Eosinophils Absolute: 330 cells/uL (ref 15–500)
HCT: 43.4 % (ref 38.5–50.0)
Hemoglobin: 15 g/dL (ref 13.2–17.1)
LYMPHS PCT: 31 %
Lymphs Abs: 1705 cells/uL (ref 850–3900)
MCH: 32.2 pg (ref 27.0–33.0)
MCHC: 34.6 g/dL (ref 32.0–36.0)
MCV: 93.1 fL (ref 80.0–100.0)
MONOS PCT: 8 %
MPV: 9.2 fL (ref 7.5–12.5)
Monocytes Absolute: 440 cells/uL (ref 200–950)
NEUTROS PCT: 54 %
Neutro Abs: 2970 cells/uL (ref 1500–7800)
PLATELETS: 261 10*3/uL (ref 140–400)
RBC: 4.66 MIL/uL (ref 4.20–5.80)
RDW: 12.8 % (ref 11.0–15.0)
WBC: 5.5 10*3/uL (ref 3.8–10.8)

## 2016-09-02 LAB — BASIC METABOLIC PANEL WITH GFR
BUN: 12 mg/dL (ref 7–25)
CALCIUM: 9.2 mg/dL (ref 8.6–10.3)
CO2: 22 mmol/L (ref 20–31)
Chloride: 106 mmol/L (ref 98–110)
Creat: 1.19 mg/dL (ref 0.60–1.35)
GFR, EST AFRICAN AMERICAN: 85 mL/min (ref >=60)
GFR, EST NON AFRICAN AMERICAN: 74 mL/min (ref >=60)
Glucose, Bld: 100 mg/dL — ABNORMAL HIGH (ref 65–99)
POTASSIUM: 4.3 mmol/L (ref 3.5–5.3)
Sodium: 140 mmol/L (ref 135–146)

## 2016-09-02 LAB — HEPATIC FUNCTION PANEL
ALBUMIN: 4.1 g/dL (ref 3.6–5.1)
ALK PHOS: 44 U/L (ref 40–115)
ALT: 17 U/L (ref 9–46)
AST: 19 U/L (ref 10–40)
BILIRUBIN INDIRECT: 0.4 mg/dL (ref 0.2–1.2)
BILIRUBIN TOTAL: 0.5 mg/dL (ref 0.2–1.2)
Bilirubin, Direct: 0.1 mg/dL (ref <=0.2)
TOTAL PROTEIN: 6.8 g/dL (ref 6.1–8.1)

## 2016-09-02 LAB — LIPID PANEL
CHOLESTEROL: 215 mg/dL — AB (ref <200)
HDL: 43 mg/dL (ref >40)
LDL CALC: 134 mg/dL — AB (ref <100)
TRIGLYCERIDES: 189 mg/dL — AB (ref <150)
Total CHOL/HDL Ratio: 5 Ratio — ABNORMAL HIGH (ref <5.0)
VLDL: 38 mg/dL — AB (ref <30)

## 2016-09-02 LAB — IRON AND TIBC
%SAT: 40 % (ref 15–60)
Iron: 129 ug/dL (ref 50–180)
TIBC: 320 ug/dL (ref 250–425)
UIBC: 191 ug/dL (ref 125–400)

## 2016-09-02 LAB — TSH: TSH: 1.83 mIU/L (ref 0.40–4.50)

## 2016-09-02 NOTE — Patient Instructions (Signed)

## 2016-09-02 NOTE — Progress Notes (Signed)
[facility]   Lucky Cowboy, M.D.      Dyanne Carrel. Steffanie Dunn, P.A.-C [facility]                [address]                [place], [place]. [address] Telephone [phone] Telefax [phone] Annual  Screening/Preventative Visit  & Comprehensive Evaluation & Examination     This very nice 46 y.o. DWM presents for a Screening/Preventative Visit & comprehensive evaluation and management of multiple medical co-morbidities.  Patient has been followed for labile HTN, Prediabetes, Hyperlipidemia and Vitamin D Deficiency.Sleep study in 2008 showed "mild Apnea". Patient is followed by Dr Evelene Croon for ADD and is on Adderall and Adderall.      Patient is followed expectantly for labile HTN and home BP's have been controlled at home.  Today's BP is sl elevated at 124/90 and rechecked at 118/87. Patient denies any cardiac symptoms as chest pain, palpitations, shortness of breath, dizziness or ankle swelling.     Patient's hyperlipidemia is not controlled with diet. Patient denies myalgias or other medication SE's. Last lipids were  Lab Results  Component Value Date   CHOL 202 (H) 08/15/2015   HDL 39 (L) 08/15/2015   LDLCALC 142 (H) 08/15/2015   TRIG 107 08/15/2015   CHOLHDL 5.2 (H) 08/15/2015      Patient has prediabetes (5.7% and 5.9% in 2013)  and patient denies reactive hypoglycemic symptoms, visual blurring, diabetic polys or paresthesias. Last A1c was at goal: Lab Results  Component Value Date   HGBA1C 5.4 08/15/2015       Finally, patient has history of Vitamin D Deficiency ("39" in 2013)  and last vitamin D was  Lab Results  Component Value Date   VD25OH 62 08/15/2015   Current Outpatient Prescriptions on File Prior to Visit  Medication Sig  . acyclovir  800 MG tablet TAKE 1 TAB 4 TIMES DAILY AS NEEDED.  Marland Kitchen ADDERALL XR 15 MG 24 hr cap   . ADDERALL 10 MG tab Take 1 to 2 tabs daily as needed for ADD  . VIT D3 5000  UNIT/ML LIQD Take by mouth daily.  Marland Kitchen PROCTO-MED HC  rectal crm APPLY RECTALLY FOUR TIMES DAILY.  . sildenafil (REVATIO) 20 MG tablet TAKE 2 TO 5 TABs DAILY AS NEEDED   Allergies  Allergen Reactions  . Amoxicillin Anhydrous [Amoxicillin]   . Doxycycline   . Eggs Or Egg-Derived Products   . Keflex [Cephalexin]   . Levaquin [Levofloxacin]   . Neomycin   . Sulfa Antibiotics    Past Medical History:  Diagnosis Date  . ADD (attention deficit disorder)   . Allergy   . Arthritis   . Family history of malignant neoplasm of gastrointestinal tract   . Hyperlipemia    Health Maintenance  Topic Date Due  . INFLUENZA VACCINE  11/26/2016  . TETANUS/TDAP  04/28/2018  . HIV Screening  Completed   Immunization History  Administered Date(s) Administered  . PPD Test 06/29/2013, 08/09/2014, 08/15/2015  . Td 04/28/2008   Past Surgical History:  Procedure Laterality Date  . NASAL FRACTURE SURGERY    . TONSILLECTOMY    . WISDOM TOOTH EXTRACTION     Family History  Problem Relation Age of Onset  . Colon polyps Mother   . Colon cancer Father     x3  . Clotting disorder Father   . Prostate cancer Brother   .  Clotting disorder Brother    Social History   Social History  . Marital status: Divorced    Spouse name: N/A  . Number of children: 2  . Years of education: college    Occupational History  . [employer]   Social History Main Topics  . Smoking status: Never Smoker  . Smokeless tobacco: Never Used  . Alcohol use Yes     Comment: 1-2 weekl   . Drug use: No  . Sexual activity: Not on file   Social History Narrative   Patient lives at home alone.    Patient is divorced.    Patient has a degree.    Patient has 2 children.     ROS Constitutional: Denies fever, chills, weight loss/gain, headaches, insomnia,  night sweats or change in appetite. Does c/o fatigue. Eyes: Denies redness, blurred vision, diplopia, discharge, itchy or watery eyes.  ENT: Denies  discharge, congestion, post nasal drip, epistaxis, sore throat, earache, hearing loss, dental pain, Tinnitus, Vertigo, Sinus pain or snoring.  Cardio: Denies chest pain, palpitations, irregular heartbeat, syncope, dyspnea, diaphoresis, orthopnea, PND, claudication or edema Respiratory: denies cough, dyspnea, DOE, pleurisy, hoarseness, laryngitis or wheezing.  Gastrointestinal: Denies dysphagia, heartburn, reflux, water brash, pain, cramps, nausea, vomiting, bloating, diarrhea, constipation, hematemesis, melena, hematochezia, jaundice or hemorrhoids Genitourinary: Denies dysuria, frequency, urgency, nocturia, hesitancy, discharge, hematuria or flank pain Musculoskeletal: Denies arthralgia, myalgia, stiffness, Jt. Swelling, pain, limp or strain/sprain. Denies Falls. Skin: Denies puritis, rash, hives, warts, acne, eczema or change in skin lesion Neuro: No weakness, tremor, incoordination, spasms, paresthesia or pain Psychiatric: Denies confusion, memory loss or sensory loss. Denies Depression. Endocrine: Denies change in weight, skin, hair change, nocturia, and paresthesia, diabetic polys, visual blurring or hyper / hypo glycemic episodes.  Heme/Lymph: No excessive bleeding, bruising or enlarged lymph nodes.  Physical Exam  BP 124/90   Pulse 76   Temp 97.3 F (36.3 C)   Resp 16   Ht 6' (1.829 m)   Wt 212 lb 3.2 oz (96.3 kg)   BMI 28.78 kg/m   General Appearance: Well nourished and well groomed and in no apparent distress.  Eyes: PERRLA, EOMs, conjunctiva no swelling or erythema, normal fundi and vessels. Sinuses: No frontal/maxillary tenderness ENT/Mouth: EACs patent / TMs  nl. Nares clear without erythema, swelling, mucoid exudates. Oral hygiene is good. No erythema, swelling, or exudate. Tongue normal, non-obstructing. Tonsils not swollen or erythematous. Hearing normal.  Neck: Supple, thyroid normal. No bruits, nodes or JVD. Respiratory: Respiratory effort normal.  BS equal and clear  bilateral without rales, rhonci, wheezing or stridor. Cardio: Heart sounds are normal with regular rate and rhythm and no murmurs, rubs or gallops. Peripheral pulses are normal and equal bilaterally without edema. No aortic or femoral bruits. Chest: symmetric with normal excursions and percussion.  Abdomen: Soft, with Nl bowel sounds. Nontender, no guarding, rebound, hernias, masses, or organomegaly.  Lymphatics: Non tender without lymphadenopathy.  Genitourinary: No hernias.Testes nl. DRE - prostate nl for age - smooth & firm w/o nodules. Musculoskeletal: Full ROM all peripheral extremities, joint stability, 5/5 strength, and normal gait. Skin: Warm and dry without rashes, lesions, cyanosis, clubbing or  ecchymosis.  Neuro: Cranial nerves intact, reflexes equal bilaterally. Normal muscle tone, no cerebellar symptoms. Sensation intact.  Pysch: Alert and oriented X 3 with normal affect, insight and judgment appropriate.   Assessment and Plan  1. Annual Preventative/Screening Exam   2. Elevated BP without diagnosis of hypertension  - EKG 12-Lead - Urinalysis, Routine  w reflex microscopic - Microalbumin / creatinine urine ratio - CBC with Differential/Platelet - BASIC METABOLIC PANEL WITH GFR - Magnesium - TSH  3. Hyperlipidemia, mixed  - EKG 12-Lead - Hepatic function panel - Lipid panel - TSH  4. Abnormal glucose  - EKG 12-Lead - Hemoglobin A1c - Insulin, random  5. 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 Screening for rectal cancer  - POC Hemoccult Bld/Stl   8. Prostate cancer screening  - PSA  9. Screening examination for pulmonary tuberculosis  - PPD  10. Screening for ischemic heart disease  - EKG 12-Lead  11. Fatigue  - Vitamin B12 - Iron and TIBC - Testosterone - CBC with Differential/Platelet  12. Medication management  - Urinalysis, Routine w  reflex microscopic - Microalbumin / creatinine urine ratio - CBC with Differential/Platelet - BASIC METABOLIC PANEL WITH GFR - Hepatic function panel - Magnesium - Lipid panel - TSH - Hemoglobin A1c - Insulin, random - VITAMIN D 25 Hydroxy        Patient was counseled in prudent diet, weight control to achieve/maintain BMI less than 25, BP monitoring, regular exercise and medications as discussed.  Discussed med effects and SE's. Routine screening labs and tests as requested with regular follow-up as recommended. Over 40 minutes of exam, counseling, chart review and high complex critical decision making was performed

## 2016-09-03 ENCOUNTER — Other Ambulatory Visit: Payer: Self-pay | Admitting: Internal Medicine

## 2016-09-03 DIAGNOSIS — E782 Mixed hyperlipidemia: Secondary | ICD-10-CM

## 2016-09-03 LAB — URINALYSIS, ROUTINE W REFLEX MICROSCOPIC
BILIRUBIN URINE: NEGATIVE
GLUCOSE, UA: NEGATIVE
Hgb urine dipstick: NEGATIVE
Ketones, ur: NEGATIVE
Leukocytes, UA: NEGATIVE
Nitrite: NEGATIVE
PH: 7.5 (ref 5.0–8.0)
SPECIFIC GRAVITY, URINE: 1.023 (ref 1.001–1.035)

## 2016-09-03 LAB — HEMOGLOBIN A1C
Hgb A1c MFr Bld: 5.4 % (ref <5.7)
MEAN PLASMA GLUCOSE: 108 mg/dL

## 2016-09-03 LAB — MICROALBUMIN / CREATININE URINE RATIO
CREATININE, URINE: 296 mg/dL (ref 20–370)
Microalb Creat Ratio: 2 mcg/mg creat (ref <30)
Microalb, Ur: 0.6 mg/dL

## 2016-09-03 LAB — INSULIN, RANDOM: INSULIN: 9.4 u[IU]/mL (ref 2.0–19.6)

## 2016-09-03 LAB — VITAMIN D 25 HYDROXY (VIT D DEFICIENCY, FRACTURES): Vit D, 25-Hydroxy: 63 ng/mL (ref 30–100)

## 2016-09-03 LAB — MAGNESIUM: Magnesium: 2 mg/dL (ref 1.5–2.5)

## 2016-09-03 LAB — PSA: PSA: 0.5 ng/mL (ref <=4.0)

## 2016-09-03 LAB — VITAMIN B12: VITAMIN B 12: 859 pg/mL (ref 200–1100)

## 2016-09-03 LAB — TESTOSTERONE: TESTOSTERONE: 520 ng/dL (ref 250–827)

## 2016-09-03 MED ORDER — ROSUVASTATIN CALCIUM 40 MG PO TABS: ORAL_TABLET | ORAL | 5 refills | Status: DC

## 2016-09-04 LAB — TB SKIN TEST
Induration: 0 mm
TB Skin Test: NEGATIVE

## 2017-01-20 ENCOUNTER — Other Ambulatory Visit: Payer: Self-pay | Admitting: Internal Medicine

## 2017-09-15 ENCOUNTER — Other Ambulatory Visit: Payer: Self-pay | Admitting: Physician Assistant

## 2017-09-17 ENCOUNTER — Encounter: Payer: Self-pay | Admitting: Internal Medicine

## 2017-09-17 NOTE — Patient Instructions (Signed)

## 2017-09-17 NOTE — Progress Notes (Signed)
[facility]   Lucky Cowboy, M.D.     Dyanne Carrel. Steffanie Dunn, P.A.-C Judd Gaudier, DNP [facility]                [address]                [place], [place]. [address] Telephone [phone] Telefax [phone] Annual  Screening/Preventative Visit  & Comprehensive Evaluation & Examination     This very nice 47 y.o. DWM presents for a Screening/Preventative Visit & comprehensive evaluation and management of multiple medical co-morbidities.  Patient has been followed for Labile HTN, HLD, Prediabetes and Vitamin D Deficiency. In 2008, patient has a sleep study showing "mild apnea". Patient is also followed by Dr Evelene Croon for ADD. Today, patient expresses concern re: ED.     Patient is followed by expectant surveillance for Labile HTN (124/90 in May 2018). Patient's BP has been controlled at home.  Today's BP is at goal - 102/70. Patient denies any cardiac symptoms as chest pain, palpitations, shortness of breath, dizziness or ankle swelling.     Patient's hyperlipidemia is controlled with diet and patient stopped Crestor due to intolerance.  Last lipids were nor at goal: Lab Results  Component Value Date   CHOL 196 09/18/2017   HDL 55 09/18/2017   LDLCALC 120 (H) 09/18/2017   TRIG 99 09/18/2017   CHOLHDL 3.6 09/18/2017      Patient has prediabetes (A1c 5.5% & 5.9%/2013) and patient denies reactive hypoglycemic symptoms, visual blurring, diabetic polys or paresthesias. Last A1c was normal & at goal: Lab Results  Component Value Date   HGBA1C 5.3 09/18/2017       Finally, patient has history of Vitamin D Deficiency ("39"/2013) and last vitamin D was at goal: Lab Results  Component Value Date   VD25OH 58 09/18/2017   Current Outpatient Medications on File Prior to Visit  Medication Sig  . acyclovir (ZOVIRAX) 800 MG tablet TAKE 1 TABLET BY MOUTH 4 TIMES DAILY AS NEEDED.  Marland Kitchen amphetamine-dextroamphetamine (ADDERALL XR) 15 MG  24 hr capsule   . amphetamine-dextroamphetamine (ADDERALL) 10 MG tablet Take 1 to 1 2 tabs daily as needed for ADD  . Cholecalciferol (VITAMIN D3) 5000 UNIT/ML LIQD Take by mouth daily.  . hydrocortisone (ANUSOL-HC) 2.5 % rectal cream APPLY RECTALLY FOUR TIMES DAILY.  . sildenafil (REVATIO) 20 MG tablet TAKE 2 TO 5 TABLETS BY MOUTH DAILY AS NEEDED  . tadalafil (ADCIRCA/CIALIS) 20 MG tablet Take 20 mg by mouth. Takes 1 tablet every 2-3 days.   No current facility-administered medications on file prior to visit.    Allergies  Allergen Reactions  . Pravastatin Sodium Other (See Comments)    Neurological side effects in 01/2013  . Amoxicillin Anhydrous [Amoxicillin]   . Doxycycline   . Eggs Or Egg-Derived Products   . Keflex [Cephalexin]   . Levaquin [Levofloxacin]   . Neomycin   . Sulfa Antibiotics    Past Medical History:  Diagnosis Date  . ADD (attention deficit disorder)   . Allergy   . Arthritis   . Family history of malignant neoplasm of gastrointestinal tract   . Hyperlipemia    Health Maintenance  Topic Date Due  . COLONOSCOPY  10/04/2017  . INFLUENZA VACCINE  11/26/2017  . TETANUS/TDAP  04/28/2018  . HIV Screening  Completed   Immunization History  Administered Date(s) Administered  . PPD Test 06/29/2013, 08/09/2014, 08/15/2015, 09/02/2016, 09/18/2017  . Td 04/28/2008  Last Colon - 06.09.2014 - Dr Dorena Cookey -Neg - recc f/u 5 yrs due to FHx Colon Ca (Fa - age 89 yo)  Past Surgical History:  Procedure Laterality Date  . NASAL FRACTURE SURGERY    . TONSILLECTOMY    . WISDOM TOOTH EXTRACTION     Family History  Problem Relation Age of Onset  . Colon polyps Mother   . Colon cancer Father        x3  . Clotting disorder Father   . Prostate cancer Brother   . Clotting disorder Brother    Social History   Socioeconomic History  . Marital status: Divorced  . Number of children: 2  . Years of education: college   Occupational History  . Occupation:  Geologist, engineering: [employer]  Tobacco Use  . Smoking status: Never Smoker  . Smokeless tobacco: Never Used  Substance and Sexual Activity  . Alcohol use: Yes    Comment: 1-2 weekly  . Drug use: No  Lifestyle  Relationships  Social History Narrative   Patient lives at home alone.    Patient is divorced.    Patient has 2 children.     ROS Constitutional: Denies fever, chills, weight loss/gain, headaches, insomnia,  night sweats or change in appetite. Does c/o fatigue. Eyes: Denies redness, blurred vision, diplopia, discharge, itchy or watery eyes.  ENT: Denies discharge, congestion, post nasal drip, epistaxis, sore throat, earache, hearing loss, dental pain, Tinnitus, Vertigo, Sinus pain or snoring.  Cardio: Denies chest pain, palpitations, irregular heartbeat, syncope, dyspnea, diaphoresis, orthopnea, PND, claudication or edema Respiratory: denies cough, dyspnea, DOE, pleurisy, hoarseness, laryngitis or wheezing.  Gastrointestinal: Denies dysphagia, heartburn, reflux, water brash, pain, cramps, nausea, vomiting, bloating, diarrhea, constipation, hematemesis, melena, hematochezia, jaundice or hemorrhoids Genitourinary: Denies dysuria, frequency, urgency, nocturia, hesitancy, discharge, hematuria or flank pain Musculoskeletal: Denies arthralgia, myalgia, stiffness, Jt. Swelling, pain, limp or strain/sprain. Denies Falls. Skin: Denies puritis, rash, hives, warts, acne, eczema or change in skin lesion Neuro: No weakness, tremor, incoordination, spasms, paresthesia or pain Psychiatric: Denies confusion, memory loss or sensory loss. Denies Depression. Endocrine: Denies change in weight, skin, hair change, nocturia, and paresthesia, diabetic polys, visual blurring or hyper / hypo glycemic episodes.  Heme/Lymph: No excessive bleeding, bruising or enlarged lymph nodes.  Physical Exam  BP 102/70   Pulse 76   Temp 97.7 F (36.5 C)   Resp 16   Ht 6' (1.829 m)   Wt 193 lb 3.2 oz  (87.6 kg)   BMI 26.20 kg/m   General Appearance: Well nourished and well groomed and in no apparent distress.  Eyes: PERRLA, EOMs, conjunctiva no swelling or erythema, normal fundi and vessels. Sinuses: No frontal/maxillary tenderness ENT/Mouth: EACs patent / TMs  nl. Nares clear without erythema, swelling, mucoid exudates. Oral hygiene is good. No erythema, swelling, or exudate. Tongue normal, non-obstructing. Tonsils not swollen or erythematous. Hearing normal.  Neck: Supple, thyroid not palpable. No bruits, nodes or JVD. Respiratory: Respiratory effort normal.  BS equal and clear bilateral without rales, rhonci, wheezing or stridor. Cardio: Heart sounds are normal with regular rate and rhythm and no murmurs, rubs or gallops. Peripheral pulses are normal and equal bilaterally without edema. No aortic or femoral bruits. Chest: symmetric with normal excursions and percussion.  Abdomen: Soft, with Nl bowel sounds. Nontender, no guarding, rebound, hernias, masses, or organomegaly.  Lymphatics: Non tender without lymphadenopathy.  Genitourinary: No hernias.Testes nl. DRE - prostate nl for age - smooth & firm w/o  nodules. Musculoskeletal: Full ROM all peripheral extremities, joint stability, 5/5 strength, and normal gait. Skin: Warm and dry without rashes, lesions, cyanosis, clubbing or  ecchymosis.  Neuro: Cranial nerves intact, reflexes equal bilaterally. Normal muscle tone, no cerebellar symptoms. Sensation intact.  Pysch: Alert and oriented X 3 with normal affect, insight and judgment appropriate.   Assessment and Plan  1. Annual Preventative/Screening Exam   2. Elevated BP without diagnosis of hypertension  - EKG 12-Lead - Urinalysis, Routine w reflex microscopic - Microalbumin / creatinine urine ratio - CBC with Differential/Platelet - COMPLETE METABOLIC PANEL WITH GFR - Magnesium - TSH  3. Hyperlipidemia, mixed  - EKG 12-Lead - Lipid panel - TSH  4. Abnormal glucose  -  EKG 12-Lead - Hemoglobin A1c - Insulin, random  5. Vitamin D deficiency  - VITAMIN D 25 Hydroxyl  6. Screening for colorectal cancer  - POC Hemoccult Bld/Stl  7. Prostate cancer screening  - PSA  8. Screening examination for pulmonary tuberculosis   9. Screening for ischemic heart disease  - EKG 12-Lead  10. Fatigue  - Iron,Total/Total Iron Binding Cap - Vitamin B12 - Testosterone - CBC with Differential/Platelet - TSH  11. Medication management  - Urinalysis, Routine w reflex microscopic - Microalbumin / creatinine urine ratio - CBC with Differential/Platelet - COMPLETE METABOLIC PANEL WITH GFR - Magnesium - Lipid panel - TSH - Hemoglobin A1c - Insulin, random - VITAMIN D 25 Hydroxyl           Patient was counseled in prudent diet, weight control to achieve/maintain BMI less than 25, BP monitoring, regular exercise and medications as discussed.  Discussed med effects and SE's. Routine screening labs and tests as requested with regular follow-up as recommended. Over 40 minutes of exam, counseling, chart review and high complex critical decision making was performed

## 2017-09-18 ENCOUNTER — Ambulatory Visit: Payer: BLUE CROSS/BLUE SHIELD | Admitting: Internal Medicine

## 2017-09-18 VITALS — BP 102/70 | HR 76 | Temp 97.7°F | Resp 16 | Ht 72.0 in | Wt 193.2 lb

## 2017-09-18 DIAGNOSIS — Z0000 Encounter for general adult medical examination without abnormal findings: Secondary | ICD-10-CM | POA: Diagnosis not present

## 2017-09-18 DIAGNOSIS — E559 Vitamin D deficiency, unspecified: Secondary | ICD-10-CM | POA: Diagnosis not present

## 2017-09-18 DIAGNOSIS — Z1389 Encounter for screening for other disorder: Secondary | ICD-10-CM | POA: Diagnosis not present

## 2017-09-18 DIAGNOSIS — Z130 Encounter for screening for diseases of the blood and blood-forming organs and certain disorders involving the immune mechanism: Secondary | ICD-10-CM

## 2017-09-18 DIAGNOSIS — Z136 Encounter for screening for cardiovascular disorders: Secondary | ICD-10-CM

## 2017-09-18 DIAGNOSIS — Z131 Encounter for screening for diabetes mellitus: Secondary | ICD-10-CM | POA: Diagnosis not present

## 2017-09-18 DIAGNOSIS — Z111 Encounter for screening for respiratory tuberculosis: Secondary | ICD-10-CM | POA: Diagnosis not present

## 2017-09-18 DIAGNOSIS — R030 Elevated blood-pressure reading, without diagnosis of hypertension: Secondary | ICD-10-CM | POA: Diagnosis not present

## 2017-09-18 DIAGNOSIS — E782 Mixed hyperlipidemia: Secondary | ICD-10-CM

## 2017-09-18 DIAGNOSIS — R7309 Other abnormal glucose: Secondary | ICD-10-CM

## 2017-09-18 DIAGNOSIS — Z125 Encounter for screening for malignant neoplasm of prostate: Secondary | ICD-10-CM

## 2017-09-18 DIAGNOSIS — Z1212 Encounter for screening for malignant neoplasm of rectum: Secondary | ICD-10-CM

## 2017-09-18 DIAGNOSIS — Z0001 Encounter for general adult medical examination with abnormal findings: Secondary | ICD-10-CM

## 2017-09-18 DIAGNOSIS — R5383 Other fatigue: Secondary | ICD-10-CM

## 2017-09-18 DIAGNOSIS — Z1211 Encounter for screening for malignant neoplasm of colon: Secondary | ICD-10-CM

## 2017-09-18 DIAGNOSIS — R7303 Prediabetes: Secondary | ICD-10-CM

## 2017-09-18 DIAGNOSIS — Z79899 Other long term (current) drug therapy: Secondary | ICD-10-CM

## 2017-09-18 DIAGNOSIS — Z1329 Encounter for screening for other suspected endocrine disorder: Secondary | ICD-10-CM

## 2017-09-18 DIAGNOSIS — Z13220 Encounter for screening for lipoid disorders: Secondary | ICD-10-CM

## 2017-09-19 ENCOUNTER — Other Ambulatory Visit: Payer: Self-pay | Admitting: Internal Medicine

## 2017-09-19 DIAGNOSIS — E782 Mixed hyperlipidemia: Secondary | ICD-10-CM

## 2017-09-19 MED ORDER — EZETIMIBE 10 MG PO TABS: 10.0000 mg | ORAL_TABLET | Freq: Every day | ORAL | 1 refills | Status: DC

## 2017-09-20 ENCOUNTER — Encounter: Payer: Self-pay | Admitting: Internal Medicine

## 2017-09-22 ENCOUNTER — Encounter: Payer: Self-pay | Admitting: *Deleted

## 2017-09-22 LAB — TB SKIN TEST
INDURATION: 0 mm
TB SKIN TEST: NEGATIVE

## 2017-09-24 LAB — COMPLETE METABOLIC PANEL WITH GFR
AG RATIO: 1.7 (calc) (ref 1.0–2.5)
ALKALINE PHOSPHATASE (APISO): 49 U/L (ref 40–115)
ALT: 18 U/L (ref 9–46)
AST: 20 U/L (ref 10–40)
Albumin: 4.7 g/dL (ref 3.6–5.1)
BUN: 13 mg/dL (ref 7–25)
CALCIUM: 9.6 mg/dL (ref 8.6–10.3)
CO2: 26 mmol/L (ref 20–32)
Chloride: 105 mmol/L (ref 98–110)
Creat: 1.21 mg/dL (ref 0.60–1.35)
GFR, EST NON AFRICAN AMERICAN: 72 mL/min/{1.73_m2} (ref >=60)
GFR, Est African American: 83 mL/min/{1.73_m2} (ref >=60)
GLOBULIN: 2.8 g/dL (ref 1.9–3.7)
Glucose, Bld: 90 mg/dL (ref 65–99)
POTASSIUM: 4 mmol/L (ref 3.5–5.3)
SODIUM: 141 mmol/L (ref 135–146)
Total Bilirubin: 1 mg/dL (ref 0.2–1.2)
Total Protein: 7.5 g/dL (ref 6.1–8.1)

## 2017-09-24 LAB — MAGNESIUM: Magnesium: 2.1 mg/dL (ref 1.5–2.5)

## 2017-09-24 LAB — CBC WITH DIFFERENTIAL/PLATELET
BASOS ABS: 31 {cells}/uL (ref 0–200)
Basophils Relative: 0.6 %
EOS ABS: 302 {cells}/uL (ref 15–500)
Eosinophils Relative: 5.8 %
HEMATOCRIT: 44.7 % (ref 38.5–50.0)
HEMOGLOBIN: 15.5 g/dL (ref 13.2–17.1)
LYMPHS ABS: 1300 {cells}/uL (ref 850–3900)
MCH: 32.2 pg (ref 27.0–33.0)
MCHC: 34.7 g/dL (ref 32.0–36.0)
MCV: 92.9 fL (ref 80.0–100.0)
MPV: 10 fL (ref 7.5–12.5)
Monocytes Relative: 9.8 %
NEUTROS ABS: 3058 {cells}/uL (ref 1500–7800)
Neutrophils Relative %: 58.8 %
Platelets: 223 10*3/uL (ref 140–400)
RBC: 4.81 10*6/uL (ref 4.20–5.80)
RDW: 12.3 % (ref 11.0–15.0)
Total Lymphocyte: 25 %
WBC mixed population: 510 cells/uL (ref 200–950)
WBC: 5.2 10*3/uL (ref 3.8–10.8)

## 2017-09-24 LAB — URINALYSIS, ROUTINE W REFLEX MICROSCOPIC
BACTERIA UA: NONE SEEN /HPF
Bilirubin Urine: NEGATIVE
Glucose, UA: NEGATIVE
Hyaline Cast: NONE SEEN /LPF
Leukocytes, UA: NEGATIVE
NITRITE: NEGATIVE
Protein, ur: NEGATIVE
SQUAMOUS EPITHELIAL / LPF: NONE SEEN /HPF (ref <=5)
Specific Gravity, Urine: 1.028 (ref 1.001–1.03)
pH: 5.5 (ref 5.0–8.0)

## 2017-09-24 LAB — TESTOSTERONE: Testosterone: 597 ng/dL (ref 250–827)

## 2017-09-24 LAB — MICROALBUMIN / CREATININE URINE RATIO
CREATININE, URINE: 350 mg/dL — AB (ref 20–320)
MICROALB UR: 0.7 mg/dL
Microalb Creat Ratio: 2 mcg/mg creat (ref <30)

## 2017-09-24 LAB — IRON, TOTAL/TOTAL IRON BINDING CAP
%SAT: 60 % (calc) (ref 15–60)
Iron: 208 ug/dL — ABNORMAL HIGH (ref 50–180)
TIBC: 348 ug/dL (ref 250–425)

## 2017-09-24 LAB — VITAMIN B12: VITAMIN B 12: 525 pg/mL (ref 200–1100)

## 2017-09-24 LAB — HEMOGLOBIN A1C
HEMOGLOBIN A1C: 5.3 %{Hb} (ref <5.7)
MEAN PLASMA GLUCOSE: 105 (calc)
eAG (mmol/L): 5.8 (calc)

## 2017-09-24 LAB — LIPID PANEL
CHOL/HDL RATIO: 3.6 (calc) (ref <5.0)
CHOLESTEROL: 196 mg/dL (ref <200)
HDL: 55 mg/dL (ref >40)
LDL Cholesterol (Calc): 120 mg/dL (calc) — ABNORMAL HIGH
Non-HDL Cholesterol (Calc): 141 mg/dL (calc) — ABNORMAL HIGH (ref <130)
Triglycerides: 99 mg/dL (ref <150)

## 2017-09-24 LAB — PSA: PSA: 1.1 ng/mL (ref <=4.0)

## 2017-09-24 LAB — INSULIN, RANDOM: INSULIN: 4.7 u[IU]/mL (ref 2.0–19.6)

## 2017-09-24 LAB — VITAMIN D 25 HYDROXY (VIT D DEFICIENCY, FRACTURES): Vit D, 25-Hydroxy: 58 ng/mL (ref 30–100)

## 2017-09-24 LAB — TSH: TSH: 1.62 m[IU]/L (ref 0.40–4.50)

## 2018-01-07 ENCOUNTER — Ambulatory Visit (INDEPENDENT_AMBULATORY_CARE_PROVIDER_SITE_OTHER): Payer: BLUE CROSS/BLUE SHIELD | Admitting: Internal Medicine

## 2018-01-07 VITALS — BP 124/64 | HR 84 | Temp 97.2°F | Resp 18 | Ht 72.0 in | Wt 198.2 lb

## 2018-01-07 DIAGNOSIS — E559 Vitamin D deficiency, unspecified: Secondary | ICD-10-CM

## 2018-01-07 DIAGNOSIS — Z79899 Other long term (current) drug therapy: Secondary | ICD-10-CM | POA: Diagnosis not present

## 2018-01-07 DIAGNOSIS — R7309 Other abnormal glucose: Secondary | ICD-10-CM

## 2018-01-07 DIAGNOSIS — E782 Mixed hyperlipidemia: Secondary | ICD-10-CM

## 2018-01-07 DIAGNOSIS — R030 Elevated blood-pressure reading, without diagnosis of hypertension: Secondary | ICD-10-CM

## 2018-01-07 MED ORDER — DEXAMETHASONE 0.5 MG PO TABS: ORAL_TABLET | ORAL | 1 refills | Status: DC

## 2018-01-07 NOTE — Progress Notes (Addendum)
This very nice 47 y.o. DWM presents for 3 month follow up with HTN, HLD, Pre-Diabetes and Vitamin D Deficiency. Patient had a sleep study in 2008 with dx/o "mild apnea". He is followed by Dr Evelene Croon for ADD.      Patient is followed expectantly for labile HTN (08/2016) & BP has been controlled at home. Today's BP is at goal - 124/64. Patient has had no complaints of any cardiac type chest pain, palpitations, dyspnea / orthopnea / PND, dizziness, claudication, or dependent edema.     Hyperlipidemia is not controlled with diet & patient alleges severe intolerance to Statins & Zetia & is reticent to take any meds for elevated lipids. Last Lipids were not at goal: Lab Results  Component Value Date   CHOL 196 09/18/2017   HDL 55 09/18/2017   LDLCALC 120 (H) 09/18/2017   TRIG 99 09/18/2017   CHOLHDL 3.6 09/18/2017      Also, the patient has history of PreDiabetes (A1c 5.5% & 5.9%/2013)  and has had no symptoms of reactive hypoglycemia, diabetic polys, paresthesias or visual blurring.  Last A1c was Normal & at goal: Lab Results  Component Value Date   HGBA1C 5.3 09/18/2017      Further, the patient also has history of Vitamin D Deficiency ("39"/2013)  and supplements vitamin D without any suspected side-effects. Last vitamin D was at goal: Lab Results  Component Value Date   VD25OH 58 09/18/2017   Current Outpatient Medications on File Prior to Visit  Medication Sig  . acyclovir (ZOVIRAX) 800 MG tablet TAKE 1 TABLET BY MOUTH 4 TIMES DAILY AS NEEDED.  Marland Kitchen Cholecalciferol (VITAMIN D3) 5000 UNIT/ML LIQD Take by mouth daily.  . hydrocortisone (ANUSOL-HC) 2.5 % rectal cream APPLY RECTALLY FOUR TIMES DAILY.  . sildenafil (REVATIO) 20 MG tablet TAKE 2 TO 5 TABLETS BY MOUTH DAILY AS NEEDED   No current facility-administered medications on file prior to visit.    Allergies  Allergen Reactions  . Pravastatin Sodium Other (See Comments)    Neurological side effects in 01/2013  . Amoxicillin  Anhydrous [Amoxicillin]   . Doxycycline   . Eggs Or Egg-Derived Products   . Keflex [Cephalexin]   . Levaquin [Levofloxacin]   . Neomycin   . Sulfa Antibiotics    PMHx:   Past Medical History:  Diagnosis Date  . ADD (attention deficit disorder)   . Allergy   . Arthritis   . Family history of malignant neoplasm of gastrointestinal tract   . Hyperlipemia    Immunization History  Administered Date(s) Administered  . PPD Test 06/29/2013, 08/09/2014, 08/15/2015, 09/02/2016, 09/18/2017  . Td 04/28/2008   Past Surgical History:  Procedure Laterality Date  . NASAL FRACTURE SURGERY    . TONSILLECTOMY    . WISDOM TOOTH EXTRACTION     FHx:    Reviewed / unchanged  SHx:    Reviewed / unchanged   Systems Review:  Constitutional: Denies fever, chills, wt changes, headaches, insomnia, fatigue, night sweats, change in appetite. Eyes: Denies redness, blurred vision, diplopia, discharge, itchy, watery eyes.  ENT: Denies discharge, congestion, post nasal drip, epistaxis, sore throat, earache, hearing loss, dental pain, tinnitus, vertigo, sinus pain, snoring.  CV: Denies chest pain, palpitations, irregular heartbeat, syncope, dyspnea, diaphoresis, orthopnea, PND, claudication or edema. Respiratory: denies cough, dyspnea, DOE, pleurisy, hoarseness, laryngitis, wheezing.  Gastrointestinal: Denies dysphagia, odynophagia, heartburn, reflux, water brash, abdominal pain or cramps, nausea, vomiting, bloating, diarrhea, constipation, hematemesis, melena, hematochezia  or hemorrhoids. Genitourinary:  Denies dysuria, frequency, urgency, nocturia, hesitancy, discharge, hematuria or flank pain. Musculoskeletal: Denies arthralgias, myalgias, stiffness, jt. swelling, pain, limping or strain/sprain.  Skin: Denies pruritus, rash, hives, warts, acne, eczema or change in skin lesion(s). Neuro: No weakness, tremor, incoordination, spasms, paresthesia or pain. Psychiatric: Denies confusion, memory loss or sensory  loss. Endo: Denies change in weight, skin or hair change.  Heme/Lymph: No excessive bleeding, bruising or enlarged lymph nodes.  Physical Exam  BP 124/64   Pulse 84   Temp (!) 97.2 F (36.2 C)   Resp 18   Ht 6' (1.829 m)   Wt 198 lb 3.2 oz (89.9 kg)   BMI 26.88 kg/m   Appears  well nourished, well groomed  and in no distress.  Eyes: PERRLA, EOMs, conjunctiva no swelling or erythema. Sinuses: No frontal/maxillary tenderness ENT/Mouth: EAC's clear, TM's nl w/o erythema, bulging. Nares clear w/o erythema, swelling, exudates. Oropharynx clear without erythema or exudates. Oral hygiene is good. Tongue normal, non obstructing. Hearing intact.  Neck: Supple. Thyroid not palpable. Car 2+/2+ without bruits, nodes or JVD. Chest: Respirations nl with BS clear & equal w/o rales, rhonchi, wheezing or stridor.  Cor: Heart sounds normal w/ regular rate and rhythm without sig. murmurs, gallops, clicks or rubs. Peripheral pulses normal and equal  without edema.  Abdomen: Soft & bowel sounds normal. Non-tender w/o guarding, rebound, hernias, masses or organomegaly.  Lymphatics: Unremarkable.  Musculoskeletal: Full ROM all peripheral extremities, joint stability, 5/5 strength and normal gait.  Skin: Warm, dry without exposed rashes, lesions or ecchymosis apparent.  Neuro: Cranial nerves intact, reflexes equal bilaterally. Sensory-motor testing grossly intact. Tendon reflexes grossly intact.  Pysch: Alert & oriented x 3.  Insight and judgement nl & appropriate. No ideations.  Assessment and Plan:  1. Elevated BP without diagnosis of hypertension  - Continue medication, monitor blood pressure at home.  - Continue DASH diet.  Reminder to go to the ER if any CP,  SOB, nausea, dizziness, severe HA, changes vision/speech.  - CBC with Differential/Platelet - COMPLETE METABOLIC PANEL WITH GFR - Magnesium - TSH  2. Hyperlipidemia, mixed  - Continue diet/meds, exercise,& lifestyle modifications.  -  Continue monitor periodic cholesterol/liver & renal functions   - Lipid panel - TSH  3. Abnormal glucose  - Continue diet, exercise, lifestyle modifications.  - Monitor appropriate labs.  - Hemoglobin A1c - Insulin, random  4. Vitamin D deficiency  - Continue supplementation.   - VITAMIN D 25 Hydroxyl  5. Medication management  - CBC with Differential/Platelet - COMPLETE METABOLIC PANEL WITH GFR - Magnesium - Lipid panel - TSH - Hemoglobin A1c - Insulin, random - VITAMIN D 25 Hydroxyl       Discussed  regular exercise, BP monitoring, weight control to achieve/maintain BMI less than 25 and discussed med and SE's. Recommended labs to assess and monitor clinical status with further disposition pending results of labs. Over 30 minutes of exam, counseling, chart review was performed.

## 2018-01-07 NOTE — Patient Instructions (Signed)

## 2018-01-08 LAB — CBC WITH DIFFERENTIAL/PLATELET
Basophils Absolute: 48 cells/uL (ref 0–200)
Basophils Relative: 0.8 %
EOS PCT: 11 %
Eosinophils Absolute: 660 cells/uL — ABNORMAL HIGH (ref 15–500)
HEMATOCRIT: 44.8 % (ref 38.5–50.0)
Hemoglobin: 15.3 g/dL (ref 13.2–17.1)
Lymphs Abs: 1680 cells/uL (ref 850–3900)
MCH: 31.7 pg (ref 27.0–33.0)
MCHC: 34.2 g/dL (ref 32.0–36.0)
MCV: 92.8 fL (ref 80.0–100.0)
MPV: 10.2 fL (ref 7.5–12.5)
Monocytes Relative: 8.5 %
NEUTROS PCT: 51.7 %
Neutro Abs: 3102 cells/uL (ref 1500–7800)
Platelets: 220 10*3/uL (ref 140–400)
RBC: 4.83 10*6/uL (ref 4.20–5.80)
RDW: 11.4 % (ref 11.0–15.0)
TOTAL LYMPHOCYTE: 28 %
WBC: 6 10*3/uL (ref 3.8–10.8)
WBCMIX: 510 {cells}/uL (ref 200–950)

## 2018-01-08 LAB — COMPLETE METABOLIC PANEL WITH GFR
AG RATIO: 1.6 (calc) (ref 1.0–2.5)
ALBUMIN MSPROF: 4.3 g/dL (ref 3.6–5.1)
ALT: 13 U/L (ref 9–46)
AST: 18 U/L (ref 10–40)
Alkaline phosphatase (APISO): 42 U/L (ref 40–115)
BILIRUBIN TOTAL: 0.8 mg/dL (ref 0.2–1.2)
BUN: 12 mg/dL (ref 7–25)
CALCIUM: 9.3 mg/dL (ref 8.6–10.3)
CO2: 27 mmol/L (ref 20–32)
Chloride: 105 mmol/L (ref 98–110)
Creat: 1.29 mg/dL (ref 0.60–1.35)
GFR, EST AFRICAN AMERICAN: 77 mL/min/{1.73_m2} (ref >=60)
GFR, EST NON AFRICAN AMERICAN: 66 mL/min/{1.73_m2} (ref >=60)
Globulin: 2.7 g/dL (calc) (ref 1.9–3.7)
Glucose, Bld: 88 mg/dL (ref 65–99)
POTASSIUM: 3.9 mmol/L (ref 3.5–5.3)
SODIUM: 141 mmol/L (ref 135–146)
TOTAL PROTEIN: 7 g/dL (ref 6.1–8.1)

## 2018-01-08 LAB — LIPID PANEL
CHOLESTEROL: 227 mg/dL — AB (ref <200)
HDL: 49 mg/dL (ref >40)
LDL Cholesterol (Calc): 152 mg/dL (calc) — ABNORMAL HIGH
Non-HDL Cholesterol (Calc): 178 mg/dL (calc) — ABNORMAL HIGH (ref <130)
Total CHOL/HDL Ratio: 4.6 (calc) (ref <5.0)
Triglycerides: 135 mg/dL (ref <150)

## 2018-01-08 LAB — INSULIN, RANDOM: INSULIN: 19.5 u[IU]/mL (ref 2.0–19.6)

## 2018-01-08 LAB — VITAMIN D 25 HYDROXY (VIT D DEFICIENCY, FRACTURES): Vit D, 25-Hydroxy: 85 ng/mL (ref 30–100)

## 2018-01-08 LAB — MAGNESIUM: MAGNESIUM: 1.9 mg/dL (ref 1.5–2.5)

## 2018-01-08 LAB — HEMOGLOBIN A1C
EAG (MMOL/L): 6.2 (calc)
HEMOGLOBIN A1C: 5.5 %{Hb} (ref <5.7)
MEAN PLASMA GLUCOSE: 111 (calc)

## 2018-01-08 LAB — TSH: TSH: 1.73 m[IU]/L (ref 0.40–4.50)

## 2018-01-10 ENCOUNTER — Encounter: Payer: Self-pay | Admitting: Internal Medicine

## 2018-01-12 ENCOUNTER — Other Ambulatory Visit: Payer: Self-pay | Admitting: Internal Medicine

## 2018-01-12 DIAGNOSIS — E782 Mixed hyperlipidemia: Secondary | ICD-10-CM

## 2018-02-09 ENCOUNTER — Other Ambulatory Visit: Payer: Self-pay | Admitting: Internal Medicine

## 2018-02-18 ENCOUNTER — Ambulatory Visit: Payer: PRIVATE HEALTH INSURANCE | Admitting: Cardiology

## 2018-03-01 ENCOUNTER — Ambulatory Visit: Payer: PRIVATE HEALTH INSURANCE | Admitting: Cardiology

## 2018-03-01 ENCOUNTER — Encounter: Payer: Self-pay | Admitting: Cardiology

## 2018-03-01 VITALS — BP 117/71 | HR 84 | Ht 71.0 in | Wt 201.0 lb

## 2018-03-01 DIAGNOSIS — Z7189 Other specified counseling: Secondary | ICD-10-CM | POA: Diagnosis not present

## 2018-03-01 DIAGNOSIS — E782 Mixed hyperlipidemia: Secondary | ICD-10-CM

## 2018-03-01 NOTE — Patient Instructions (Addendum)
Medication Instructions:  Your Physician recommend you continue on your current medication as directed.    If you need a refill on your cardiac medications before your next appointment, please call your pharmacy.   Lab work: Your physician recommends that you return for lab work today (Lipid, CRP, LPA)  If you have labs (blood work) drawn today and your tests are completely normal, you will receive your results only by: Marland Kitchen MyChart Message (if you have MyChart) OR . A paper copy in the mail If you have any lab test that is abnormal or we need to change your treatment, we will call you to review the results.  Testing/Procedures: Calcium Score [address]  Follow-Up: At [facility], you and your health needs are our priority.  As part of our continuing mission to provide you with exceptional heart care, we have created designated Provider Care Teams.  These Care Teams include your primary Cardiologist (physician) and Advanced Practice Providers (APPs -  Physician Assistants and Nurse Practitioners) who all work together to provide you with the care you need, when you need it. You will need a follow up appointment in 6 months.  Please call our office 2 months in advance to schedule this appointment.  You may see Dr. Cristal Deer or one of the following Advanced Practice Providers on your designated Care Team:   Theodore Demark, PA-C . Joni Reining, DNP, ANP  Any Other Special Instructions Will Be Listed Below (If Applicable).

## 2018-03-01 NOTE — Progress Notes (Signed)
Cardiology Office Note:    Date:  03/01/2018   ID:  Marcus Jones, DOB 07/22/1970, MRN [id number]  PCP:  Lucky Cowboy, MD  Cardiologist:  Jodelle Red, MD PhD  Referring MD: Lucky Cowboy, MD   CC: hyperlipidemia  History of Present Illness:    Marcus Jones is a 47 y.o. male with a hx of hypertension (diet controlled), hyperlipidemia, prediabetes who is seen as a new consult at the request of Lucky Cowboy, MD for the evaluation and management of hyperlipidemia.  Per Dr. Kathryne Sharper most recent note of 01/07/18, he has been followed for labile blood pressure. He has severe intolerance to statins and ezetimibe and has not had lipids reach goal.  Patient concerns: urologist was concerned that he had been on ED meds for >10 years, and this may suggest a problem with his heart. Had severe reaction on statins, was being worked up for ALS, multiple sclerosis, etc but all symptoms went away with stopping statin. All testing did not show reason for statin intolerance. He reports similar symptoms on ezetimibe as well.   Has occasional mild LE edema (sock lines). Never had high blood pressure requiring treatment. No chest pain, shortness of breath, PND, orthopnea, syncope. Had childhood asthma but no remaining issues now.   FH: He is the only child of both of his parents; rest are half siblings of either mom or dad. mother is 45, on no meds but had high chol in the past. Brother had issues with lipitor in the past. Multiple people with poor circulation. Father died 3 years ago, had a pacemaker but no other known heart issues. Father had recurrent colon cancer history. Brother has pacemaker as well.   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 1-2 beers/day 4-5 times/week.   Past Medical History:  Diagnosis Date  . ADD (attention deficit disorder)   . Allergy   . Arthritis   . Family history of malignant neoplasm of gastrointestinal tract   . Hyperlipemia     Past  Surgical History:  Procedure Laterality Date  . NASAL FRACTURE SURGERY    . TONSILLECTOMY    . WISDOM TOOTH EXTRACTION      Current Medications: Current Outpatient Medications on File Prior to Visit  Medication Sig  . acyclovir (ZOVIRAX) 800 MG tablet TAKE 1 TABLET BY MOUTH 4 TIMES DAILY AS NEEDED.  Marland Kitchen Cholecalciferol (VITAMIN D3) 5000 UNIT/ML LIQD Take by mouth daily.  . hydrocortisone (ANUSOL-HC) 2.5 % rectal cream APPLY RECTALLY FOUR TIMES DAILY.  . sildenafil (REVATIO) 20 MG tablet TAKE 2-5 TABLETS BY MOUTH DAILY AS NEEDED  . SILDENAFIL CITRATE PO Take 100 mg by mouth.   No current facility-administered medications on file prior to visit.      Allergies:   Pravastatin sodium; Amoxicillin anhydrous [amoxicillin]; Doxycycline; Eggs or egg-derived products; Keflex [cephalexin]; Levaquin [levofloxacin]; Neomycin; and Sulfa antibiotics   Social History   Socioeconomic History  . Marital status: Divorced    Spouse name: Not on file  . Number of children: 2  . Years of education: college   . Highest education level: Not on file  Occupational History  . Occupation: Geologist, engineering: [employer]  Social Needs  . Financial resource strain: Not on file  . Food insecurity:    Worry: Not on file    Inability: Not on file  . Transportation needs:    Medical: Not on file    Non-medical: Not on file  Tobacco Use  . Smoking status:  Never Smoker  . Smokeless tobacco: Never Used  Substance and Sexual Activity  . Alcohol use: Yes    Comment: 1-2 weekl   . Drug use: No  . Sexual activity: Not on file  Lifestyle  . Physical activity:    Days per week: Not on file    Minutes per session: Not on file  . Stress: Not on file  Relationships  . Social connections:    Talks on phone: Not on file    Gets together: Not on file    Attends religious service: Not on file    Active member of club or organization: Not on file    Attends meetings of clubs or organizations: Not on  file    Relationship status: Not on file  Other Topics Concern  . Not on file  Social History Narrative   Patient lives at home alone.    Patient is divorced.    Patient has a degree.    Patient has 2 children.      Family History: The patient's family history includes Clotting disorder in his brother and father; Colon cancer in his father; Colon polyps in his mother; Prostate cancer in his brother.  He is the only child of both of his parents; rest are half siblings of either mom or dad. mother is 46, on no meds but had high chol in the past. Brother had issues with lipitor in the past. Multiple people with poor circulation. Father died 3 years ago, had a pacemaker but no other known heart issues. Father had recurrent colon cancer history. Brother has pacemaker as well.   ROS:   Please see the history of present illness.  Additional pertinent ROS:  Constitutional: Negative for chills, fever, night sweats, unintentional weight loss  HENT: Negative for ear pain and hearing loss.   Eyes: Negative for loss of vision and eye pain.  Respiratory: Negative for cough, sputum, shortness of breath, wheezing.   Cardiovascular: Negative for chest pain, palpitations , PND, orthopnea, lower extremity edema and claudication.  Gastrointestinal: Negative for abdominal pain, melena, and hematochezia.  Genitourinary: Negative for dysuria and hematuria.  Musculoskeletal: Negative for falls and myalgias.  Skin: Negative for itching and rash.  Neurological: Negative for focal weakness, focal sensory changes and loss of consciousness.  Endo/Heme/Allergies: Does not bruise/bleed easily.    EKGs/Labs/Other Studies Reviewed:    The following studies were reviewed today: Prior notes and labs.   EKG:  EKG is ordered today.  The ekg ordered today demonstrates normal sinus rhythm  Recent Labs: 01/07/2018: ALT 13; BUN 12; Creat 1.29; Hemoglobin 15.3; Magnesium 1.9; Platelets 220; Potassium 3.9; Sodium 141; TSH  1.73  Recent Lipid Panel    Component Value Date/Time   CHOL 205 (H) 03/01/2018 1013   TRIG 122 03/01/2018 1013   HDL 48 03/01/2018 1013   CHOLHDL 4.6 01/07/2018 1218   VLDL 38 (H) 09/02/2016 1028   LDLCALC 133 (H) 03/01/2018 1013   LDLCALC 152 (H) 01/07/2018 1218    Physical Exam:    VS:  BP 117/71   Pulse 84   Ht 5\' 11"  (1.803 m)   Wt 201 lb (91.2 kg)   BMI 28.03 kg/m     Wt Readings from Last 3 Encounters:  03/01/18 201 lb (91.2 kg)  01/07/18 198 lb 3.2 oz (89.9 kg)  09/18/17 193 lb 3.2 oz (87.6 kg)     GEN: Well nourished, well developed in no acute distress HEENT: Normal NECK: No JVD; No  carotid bruits LYMPHATICS: No lymphadenopathy CARDIAC: regular rhythm, normal S1 and S2, no murmurs, rubs, gallops. Radial and DP pulses 2+ bilaterally. RESPIRATORY:  Clear to auscultation without rales, wheezing or rhonchi  ABDOMEN: Soft, non-tender, non-distended MUSCULOSKELETAL:  No edema; No deformity  SKIN: Warm and dry NEUROLOGIC:  Alert and oriented x 3 PSYCHIATRIC:  Normal affect   ASSESSMENT:    1. Hyperlipidemia   2. Counseling on health promotion and disease prevention    PLAN:    1. Hyperlipidemia: has had neurologic and musculoskeletal issues on pravastatin and ezetimibe. Declines these medications.  -Tchol 227, HDL 48, LDL 152, TG 135.  -he would like to better understand his risk of cardiovascular disease. We discussed multiple ways that we can assess this, including hs-CRP, lp(a), and calcium score. He wishes to gather as much information as he can to determine his risk. He prefers to take no medications, but if his testing indicates increased risk, he would be amenable to further conversations about options.  2. Prevention -recommend heart healthy/Mediterranean diet, with whole grains, fruits, vegetable, fish, lean meats, nuts, and olive oil. Limit salt. -recommend moderate walking, 3-5 times/week for 30-50 minutes each session. Aim for at least 150  minutes.week. Goal should be pace of 3 miles/hours, or walking 1.5 miles in 30 minutes -recommend avoidance of tobacco products. Avoid excess alcohol. -Additional risk factor control:  -Diabetes: A1c is 5.5  -Lipids: as above  -Blood pressure control: at goal on no meds  -Weight: BMI 28.  -ASCVD risk score: The 10-year ASCVD risk score Denman George DC Montez Hageman., et al., 2013) is: 2.1%   Values used to calculate the score:     Age: 19 years     Sex: Male     Is Non-Hispanic African American: No     Diabetic: No     Tobacco smoker: No     Systolic Blood Pressure: 117 mmHg     Is BP treated: No     HDL Cholesterol: 48 mg/dL     Total Cholesterol: 205 mg/dL   Additional results: lp(a) pending, hs-CRP elevated at 5.6 mg/L. TIME SPENT WITH PATIENT: >45 minutes of direct patient care. More than 50% of that time was spent on coordination of care and counseling regarding pathophysiology of hyperlipidemia, assessment of risk, recommended management options including pharmacotherapy and lifestyle.  Jodelle Red, MD, PhD [place]   Plan for follow up: 6 mos with me, possibly with lipid clinic sooner depending on results of additional tesing  Medication Adjustments/Labs and Tests Ordered: Current medicines are reviewed at length with the patient today.  Concerns regarding medicines are outlined above.  Orders Placed This Encounter  Procedures  . CT CARDIAC SCORING  . Lipid Panel With LDL/HDL Ratio  . CRP High sensitivity  . Lipoprotein A (LPA)  . EKG 12-Lead   No orders of the defined types were placed in this encounter.   Patient Instructions  Medication Instructions:  Your Physician recommend you continue on your current medication as directed.    If you need a refill on your cardiac medications before your next appointment, please call your pharmacy.   Lab work: Your physician recommends that you return for lab work today (Lipid, CRP, LPA)  If you have labs  (blood work) drawn today and your tests are completely normal, you will receive your results only by: Marland Kitchen MyChart Message (if you have MyChart) OR . A paper copy in the mail If you have any lab test that  is abnormal or we need to change your treatment, we will call you to review the results.  Testing/Procedures: Calcium Score [address]  Follow-Up: At [facility], you and your health needs are our priority.  As part of our continuing mission to provide you with exceptional heart care, we have created designated Provider Care Teams.  These Care Teams include your primary Cardiologist (physician) and Advanced Practice Providers (APPs -  Physician Assistants and Nurse Practitioners) who all work together to provide you with the care you need, when you need it. You will need a follow up appointment in 6 months.  Please call our office 2 months in advance to schedule this appointment.  You may see Dr. Cristal Deer or one of the following Advanced Practice Providers on your designated Care Team:   Theodore Demark, PA-C . Joni Reining, DNP, ANP  Any Other Special Instructions Will Be Listed Below (If Applicable).        Signed, Jodelle Red, MD PhD 03/01/2018 10:18 PM    [facility]

## 2018-03-02 ENCOUNTER — Encounter: Payer: Self-pay | Admitting: Cardiology

## 2018-03-02 LAB — LIPOPROTEIN A (LPA)

## 2018-03-02 LAB — LIPID PANEL WITH LDL/HDL RATIO
Cholesterol, Total: 205 mg/dL — ABNORMAL HIGH (ref 100–199)
HDL: 48 mg/dL (ref >39)
LDL Calculated: 133 mg/dL — ABNORMAL HIGH (ref 0–99)
LDL/HDL RATIO: 2.8 ratio (ref 0.0–3.6)
TRIGLYCERIDES: 122 mg/dL (ref 0–149)
VLDL Cholesterol Cal: 24 mg/dL (ref 5–40)

## 2018-03-02 LAB — HIGH SENSITIVITY CRP: CRP HIGH SENSITIVITY: 5.6 mg/L — AB (ref 0.00–3.00)

## 2018-03-10 ENCOUNTER — Telehealth: Payer: Self-pay | Admitting: Cardiology

## 2018-03-10 NOTE — Telephone Encounter (Signed)
Returned call to patient no answer.Unable to leave a message mailbox is full. 

## 2018-03-10 NOTE — Telephone Encounter (Signed)
Patient returned call for results 

## 2018-03-12 NOTE — Telephone Encounter (Signed)
[facility]  Notes recorded by Jodelle Redhristopher, Bridgette, MD on 03/04/2018 at 1:31 PM EST Results of cholesterol testing: Total cholesterol is high, and bad cholesterol is above what we like to see. Your lipoprotein A (independent predictor of bad cholesterol) is normal, but your CRP (inflammatory marker) is high. The high inflammatory marker plus the elevated cholesterol numbers suggest that you are at a higher risk that your cholesterol numbers alone would tell you. I think the calcium score will be the tiebreaker. If your score comes back elevated, I think we need to talk about being aggressive about prevention.

## 2018-03-17 ENCOUNTER — Ambulatory Visit (INDEPENDENT_AMBULATORY_CARE_PROVIDER_SITE_OTHER)
Admission: RE | Admit: 2018-03-17 | Discharge: 2018-03-17 | Disposition: A | Discharge status: Home / Self Care | Payer: PRIVATE HEALTH INSURANCE | Source: Ambulatory Visit | Attending: Cardiology | Admitting: Cardiology

## 2018-03-17 DIAGNOSIS — E782 Mixed hyperlipidemia: Secondary | ICD-10-CM

## 2018-04-16 ENCOUNTER — Ambulatory Visit: Payer: Self-pay | Admitting: Adult Health

## 2018-07-23 ENCOUNTER — Ambulatory Visit: Payer: Self-pay | Admitting: Internal Medicine

## 2018-07-27 ENCOUNTER — Telehealth: Payer: Self-pay | Admitting: Cardiology

## 2018-07-27 NOTE — Telephone Encounter (Signed)
New Message   Patient is calling to request an itemized statement for the CT cardiac scoring that he had on 03/17/2018. He would like for it to be emailed to him if possible at [email or web address]

## 2018-07-27 NOTE — Telephone Encounter (Signed)
Forward to [facility] billing department

## 2018-09-01 NOTE — Telephone Encounter (Signed)
Requested information sent to patient by Software engineer.

## 2018-10-06 ENCOUNTER — Ambulatory Visit: Payer: BLUE CROSS/BLUE SHIELD

## 2018-10-15 ENCOUNTER — Telehealth: Payer: Self-pay

## 2018-10-15 NOTE — Telephone Encounter (Signed)
    COVID-19 Pre-Screening Questions:  . In the past 7 to 10 days have you had a cough,  shortness of breath, headache, congestion, fever (100 or greater) body aches, chills, sore throat, or sudden loss of taste or sense of smell?NO . Have you been around anyone with known Covid 19.NO . Have you been around anyone who is awaiting Covid 19 test results in the past 7 to 10 days?NO . Have you been around anyone who has been exposed to Covid 19, or has mentioned symptoms of Covid 19 within the past 7 to 10 days?NO  If you have any concerns/questions about symptoms patients report during screening (either on the phone or at threshold). Contact the provider seeing the patient or DOD for further guidance.  If neither are available contact a member of the leadership team.   Pt made aware to wear a mask and no visitors. Pt voiced understanding.

## 2018-10-18 ENCOUNTER — Encounter: Payer: Self-pay | Admitting: Internal Medicine

## 2018-10-20 ENCOUNTER — Telehealth: Payer: Self-pay | Admitting: Cardiology

## 2018-10-20 NOTE — Telephone Encounter (Signed)
Smartphone/ declined my chart/ consent/ pre reg completed °

## 2018-10-20 NOTE — Telephone Encounter (Signed)
I called patient to confirm his appointment for 10-22-18 with Dr Margaretann Loveless.

## 2018-10-22 ENCOUNTER — Telehealth (INDEPENDENT_AMBULATORY_CARE_PROVIDER_SITE_OTHER): Payer: BLUE CROSS/BLUE SHIELD | Admitting: Cardiology

## 2018-10-22 VITALS — Ht 71.0 in | Wt 195.0 lb

## 2018-10-22 DIAGNOSIS — Z7189 Other specified counseling: Secondary | ICD-10-CM

## 2018-10-22 DIAGNOSIS — E782 Mixed hyperlipidemia: Secondary | ICD-10-CM

## 2018-10-22 DIAGNOSIS — Z79899 Other long term (current) drug therapy: Secondary | ICD-10-CM

## 2018-10-22 NOTE — Progress Notes (Signed)
Virtual Visit via Video Note   This visit type was conducted due to national recommendations for restrictions regarding the COVID-19 Pandemic (e.g. social distancing) in an effort to limit this patient's exposure and mitigate transmission in our community.  Due to his co-morbid illnesses, this patient is at least at moderate risk for complications without adequate follow up.  This format is felt to be most appropriate for this patient at this time.  All issues noted in this document were discussed and addressed.  A limited physical exam was performed with this format.  Please refer to the patient's chart for his consent to telehealth [facility].   Date:  10/22/2018   ID:  Marcus Jones, DOB [birth year], MRN [id number]  Patient Location: Home Provider Location: Office  PCP:  Lucky CowboyMcKeown, William, MD  Cardiologist:  Jodelle RedBridgette Sivan, MD  Electrophysiologist:  None   Evaluation Performed:  Follow-Up Visit  Chief Complaint:  Follow up  History of Present Illness:    Marcus Jones is a 48 y.o. male with a hx of hypertension (diet controlled), hyperlipidemia, prediabetes. His initial consult with me was on 03/01/2018 for hyperlipidemia.  The patient does not have symptoms concerning for COVID-19 infection (fever, chills, cough, or new shortness of breath).   Cardiac history: severe intolerance to statins and ezetimibe, to the point that he was worked up for ALS/multiple sclerosis. Reports both neurologic and musculoskeletal symptoms. Most recently trialed on pravastatin and ezetimibe. At initial consult, discussed ways to risk stratify. Lipids 02/2018 were Tchol 205, HDL 48, LDL 133, TG 122. Calcium score was 0 on 03/17/18, though radiology read early atherosclerotic change in proximal descending aorta. LP(a) normal at <8.4. hsCRP elevated at 5.60. Plan was for aggressive lifestyle intervention.  Today: Staying safe with coronavirus pandemic. He has been at home, not  actively working since 3/23, still with base pay but heavily bonus based.   We reviewed his lipid management strategy. Plan is to repeat lipid numbers (ordered today). He sxpects improvement in his numbers, as he is eating well and very active at home. He has been getting 20-30k steps/day.  Denies chest pain, shortness of breath at rest or with normal exertion. No PND, orthopnea, LE edema or unexpected weight gain. No syncope or palpitations.  Past Medical History:  Diagnosis Date  . ADD (attention deficit disorder)   . Allergy   . Arthritis   . Family history of malignant neoplasm of gastrointestinal tract   . Hyperlipemia    Past Surgical History:  Procedure Laterality Date  . NASAL FRACTURE SURGERY    . TONSILLECTOMY    . WISDOM TOOTH EXTRACTION       Current Meds  Medication Sig  . acyclovir (ZOVIRAX) 800 MG tablet TAKE 1 TABLET BY MOUTH 4 TIMES DAILY AS NEEDED.  Marland Kitchen. cetirizine (ZYRTEC ALLERGY) 10 MG tablet Take 10 mg by mouth daily.  . Cholecalciferol (VITAMIN D3) 5000 UNIT/ML LIQD Take by mouth daily.  . hydrocortisone (ANUSOL-HC) 2.5 % rectal cream APPLY RECTALLY FOUR TIMES DAILY.  . sildenafil (REVATIO) 20 MG tablet TAKE 2-5 TABLETS BY MOUTH DAILY AS NEEDED  . SILDENAFIL CITRATE PO Take 100 mg by mouth.     Allergies:   Pravastatin sodium, Amoxicillin anhydrous [amoxicillin], Doxycycline, Eggs or egg-derived products, Keflex [cephalexin], Levaquin [levofloxacin], Neomycin, and Sulfa antibiotics   Social History   Tobacco Use  . Smoking status: Never Smoker  . Smokeless tobacco: Never Used  Substance Use Topics  . Alcohol use: Yes  Comment: 1-2 weekl   . Drug use: No     Family Hx: The patient's family history includes Clotting disorder in his brother and father; Colon cancer in his father; Colon polyps in his mother; Prostate cancer in his brother.  ROS:   Please see the history of present illness.    Constitutional: Negative for chills, fever, night sweats,  unintentional weight loss  HENT: Negative for ear pain and hearing loss.   Eyes: Negative for loss of vision and eye pain.  Respiratory: Negative for cough, sputum, wheezing.   Cardiovascular: See HPI. Gastrointestinal: Negative for abdominal pain, melena, and hematochezia.  Genitourinary: Negative for dysuria and hematuria.  Musculoskeletal: Negative for falls and myalgias.  Skin: Negative for itching and rash.  Neurological: Negative for focal weakness, focal sensory changes and loss of consciousness.  Endo/Heme/Allergies: Does not bruise/bleed easily.  All other systems reviewed and are negative.   Prior CV studies:   The following studies were reviewed today: Calcium score, labs  Labs/Other Tests and Data Reviewed:    EKG:  An ECG dated 11/6 19 was personally reviewed today and demonstrated:  nsr  Recent Labs: 01/07/2018: ALT 13; BUN 12; Creat 1.29; Hemoglobin 15.3; Magnesium 1.9; Platelets 220; Potassium 3.9; Sodium 141; TSH 1.73   Recent Lipid Panel Lab Results  Component Value Date/Time   CHOL 205 (H) 03/01/2018 10:13 AM   TRIG 122 03/01/2018 10:13 AM   HDL 48 03/01/2018 10:13 AM   CHOLHDL 4.6 01/07/2018 12:18 PM   LDLCALC 133 (H) 03/01/2018 10:13 AM   LDLCALC 152 (H) 01/07/2018 12:18 PM    Wt Readings from Last 3 Encounters:  10/22/18 195 lb (88.5 kg)  03/01/18 201 lb (91.2 kg)  01/07/18 198 lb 3.2 oz (89.9 kg)     Objective:    Vital Signs:  Ht 5\' 11"  (1.803 m)   Wt 195 lb (88.5 kg)   BMI 27.20 kg/m    VITAL SIGNS:  reviewed GEN:  no acute distress EYES:  sclerae anicteric, EOMI - Extraocular Movements Intact RESPIRATORY:  normal respiratory effort, symmetric expansion CARDIOVASCULAR:  no visible JVD SKIN:  no rash, lesions or ulcers. MUSCULOSKELETAL:  no obvious deformities. NEURO:  alert and oriented x 3, no obvious focal deficit PSYCH:  normal affect  ASSESSMENT & PLAN:    Hyperlipidemia: has had neurologic and musculoskeletal issues on  pravastatin and ezetimibe. Declines these medications.  -ordered repeat lipid panel today, can come into the office to have drawn -we discussed guidelines, ASCVD risk score today. -had elevated hsCRP, reported atherosclerosis of aorta (though coronary calcium score 0) as risk factors -plans to continue with excellent lifestyle habits   CV risk and Prevention Counseling: -recommend heart healthy/Mediterranean diet, with whole grains, fruits, vegetable, fish, lean meats, nuts, and olive oil. Limit salt. -recommend moderate walking, 3-5 times/week for 30-50 minutes each session. Aim for at least 150 minutes.week. Goal should be pace of 3 miles/hours, or walking 1.5 miles in 30 minutes -recommend avoidance of tobacco products. Avoid excess alcohol. The 10-year ASCVD risk score Denman George(Goff [place] Montez HagemanJr., et al., 2013) is: 2.4%   Values used to calculate the score:     Age: [age] years     Sex: Male     Is Non-Hispanic African American: No     Diabetic: No     Tobacco smoker: No     Systolic Blood Pressure: 117 mmHg     Is BP treated: No     HDL Cholesterol: 48 mg/dL  Total Cholesterol: 205 mg/dL  COVID-19 Education: The signs and symptoms of COVID-19 were discussed with the patient and how to seek care for testing (follow up with PCP or arrange E-visit).  The importance of social distancing was discussed today.  Time:   Today, I have spent 15 minutes with the patient with telehealth technology discussing the above problems.    Patient Instructions  Medication Instructions:  Your Physician recommend you continue on your current medication as directed.    If you need a refill on your cardiac medications before your next appointment, please call your pharmacy.   Lab work: Your physician recommends that you return for lab work (lipids).  If you have labs (blood work) drawn today and your tests are completely normal, you will receive your results only by: Marland Kitchen MyChart Message (if you have MyChart) OR  . A paper copy in the mail If you have any lab test that is abnormal or we need to change your treatment, we will call you to review the results.  Testing/Procedures: None  Follow-Up: At [facility], you and your health needs are our priority.  As part of our continuing mission to provide you with exceptional heart care, we have created designated Provider Care Teams.  These Care Teams include your primary Cardiologist (physician) and Advanced Practice Providers (APPs -  Physician Assistants and Nurse Practitioners) who all work together to provide you with the care you need, when you need it. You will need a follow up appointment in 1 years.  Please call our office 2 months in advance to schedule this appointment.  You may see Buford Dresser, MD or one of the following Advanced Practice Providers on your designated Care Team:   Rosaria Ferries, PA-C . Jory Sims, DNP, ANP      Signed, Buford Dresser, MD  10/22/2018  [facility]

## 2018-10-22 NOTE — Patient Instructions (Signed)
Medication Instructions:  Your Physician recommend you continue on your current medication as directed.    If you need a refill on your cardiac medications before your next appointment, please call your pharmacy.   Lab work: Your physician recommends that you return for lab work (lipids).  If you have labs (blood work) drawn today and your tests are completely normal, you will receive your results only by: Marland Kitchen MyChart Message (if you have MyChart) OR . A paper copy in the mail If you have any lab test that is abnormal or we need to change your treatment, we will call you to review the results.  Testing/Procedures: None  Follow-Up: At [facility], you and your health needs are our priority.  As part of our continuing mission to provide you with exceptional heart care, we have created designated Provider Care Teams.  These Care Teams include your primary Cardiologist (physician) and Advanced Practice Providers (APPs -  Physician Assistants and Nurse Practitioners) who all work together to provide you with the care you need, when you need it. You will need a follow up appointment in 1 years.  Please call our office 2 months in advance to schedule this appointment.  You may see Buford Dresser, MD or one of the following Advanced Practice Providers on your designated Care Team:   Rosaria Ferries, PA-C . Jory Sims, DNP, ANP

## 2018-11-05 ENCOUNTER — Encounter: Payer: Self-pay | Admitting: Cardiology

## 2018-11-05 ENCOUNTER — Other Ambulatory Visit: Payer: Self-pay | Admitting: Internal Medicine

## 2018-11-05 MED ORDER — ACYCLOVIR 800 MG PO TABS: ORAL_TABLET | ORAL | 99 refills | Status: DC

## 2018-11-05 MED ORDER — HYDROCORTISONE (PERIANAL) 2.5 % EX CREA: 1.0000 "application " | TOPICAL_CREAM | Freq: Four times a day (QID) | CUTANEOUS | 3 refills | Status: DC

## 2019-02-11 ENCOUNTER — Other Ambulatory Visit: Payer: Self-pay

## 2019-02-11 DIAGNOSIS — Z20822 Contact with and (suspected) exposure to covid-19: Secondary | ICD-10-CM

## 2019-02-13 LAB — NOVEL CORONAVIRUS, NAA: SARS-CoV-2, NAA: NOT DETECTED

## 2019-03-16 ENCOUNTER — Encounter: Payer: Self-pay | Admitting: Internal Medicine

## 2019-03-16 NOTE — Patient Instructions (Signed)

## 2019-03-16 NOTE — Progress Notes (Signed)
Annual  Screening/Preventative Visit  & Comprehensive Evaluation & Examination     This very nice 48 y.o. DWM  presents for a Screening /Preventative Visit & comprehensive evaluation and management of multiple medical co-morbidities.  Patient has been followed expectantly for labile  HTN, HLD, Prediabetes and Vitamin D Deficiency.   A sleep study in 2008 showed "mild Apnea".  He is followed by Dr Toy Care for ADD.     Patient reports recent sx's of arthralgias of the small jt of the hands & wrists  > feet & toes.      Patient has been followed since May 2018 expectantly for labile HTN.  Patient's BP has been controlled at home.  Today's BP is at goal - 118/84. Patient denies any cardiac symptoms as chest pain, palpitations, shortness of breath, dizziness or ankle swelling.     Patient's hyperlipidemia is not controlled with diet & patient alleges severe intolerance to Statins & Zetia & is reticent to take any meds for elevated lipids. Patient has had recent consultation with Dr Buford Dresser for his lipids.  Last lipids were not at goal:  Lab Results  Component Value Date   CHOL 205 (H) 03/01/2018   HDL 48 03/01/2018   LDLCALC 133 (H) 03/01/2018   TRIG 122 03/01/2018   CHOLHDL 4.6 01/07/2018      Patient has hx/o prediabetes (A1c 5.5% & 5.9% / 2013)  and patient denies reactive hypoglycemic symptoms, visual blurring, diabetic polys or paresthesias. Last A1c was Normal & at goal:  Lab Results  Component Value Date   HGBA1C 5.5 01/07/2018       Finally, patient has history of Vitamin D Deficiency ("39" / 2013) and last vitamin D was at goal:  Lab Results  Component Value Date   VD25OH 85 01/07/2018   Current Outpatient Medications on File Prior to Visit  Medication Sig  . acyclovir (ZOVIRAX) 800 MG tablet Take 1 tablet 4 x /day  as needed for Herpes  . cetirizine (ZYRTEC ALLERGY) 10 MG tablet Take 10 mg by mouth daily.  . Cholecalciferol (VITAMIN D3) 5000 UNIT/ML LIQD Take  by mouth daily.  . hydrocortisone (ANUSOL-HC) 2.5 % rectal cream Place 1 application rectally 4 (four) times daily. Apply rectally  2 to 4 x /day as needed for Rectal Irritation  . sildenafil (REVATIO) 20 MG tablet TAKE 2-5 TABLETS BY MOUTH DAILY AS NEEDED  . SILDENAFIL CITRATE PO Take 100 mg by mouth.  . zinc gluconate 50 MG tablet Take 50 mg by mouth daily.   No current facility-administered medications on file prior to visit.    Allergies  Allergen Reactions  . Pravastatin Sodium Other (See Comments)    Neurological side effects in 01/2013  . Amoxicillin Anhydrous [Amoxicillin]   . Doxycycline   . Eggs Or Egg-Derived Products   . Keflex [Cephalexin]   . Levaquin [Levofloxacin]   . Neomycin   . Sulfa Antibiotics    Past Medical History:  Diagnosis Date  . ADD (attention deficit disorder)   . Allergy   . Arthritis   . Family history of malignant neoplasm of gastrointestinal tract   . Hyperlipemia    Health Maintenance  Topic Date Due  . COLONOSCOPY  10/04/2017  . INFLUENZA VACCINE  11/27/2018  . TETANUS/TDAP  10/16/2028  . HIV Screening  Completed   Immunization History  Administered Date(s) Administered  . PPD Test 06/29/2013, 08/09/2014, 08/15/2015, 09/02/2016, 09/18/2017, 03/17/2019  . Td 04/28/2008  . Tdap 10/17/2018  Last Colon - 10/04/2012 - Dr Deborha Payment - Recc 5 year f/u due June 2019 - patient aware he needs to reschedule  Past Surgical History:  Procedure Laterality Date  . NASAL FRACTURE SURGERY    . TONSILLECTOMY    . WISDOM TOOTH EXTRACTION     Family History  Problem Relation Age of Onset  . Colon polyps Mother   . Colon cancer Father        x3  . Clotting disorder Father   . Prostate cancer Brother   . Clotting disorder Brother    Social History   Socioeconomic History  . Marital status: Divorced    Spouse name: Not on file  . Number of children: 2 children  . Years of education: college   . Highest education level: Not on file   Occupational History  . Occupation: Engineer, site: [employer]  Tobacco Use  . Smoking status: Never Smoker  . Smokeless tobacco: Never Used  Substance and Sexual Activity  . Alcohol use: Yes    Comment: 1-2 weekl   . Drug use: No  . Sexual activity: Not on file  Social History Narrative   Patient lives at home alone.    Patient is divorced.    Patient has a degree.    Patient has 2 children.     ROS Constitutional: Denies fever, chills, weight loss/gain, headaches, insomnia,  night sweats or change in appetite. Does c/o fatigue. Eyes: Denies redness, blurred vision, diplopia, discharge, itchy or watery eyes.  ENT: Denies discharge, congestion, post nasal drip, epistaxis, sore throat, earache, hearing loss, dental pain, Tinnitus, Vertigo, Sinus pain or snoring.  Cardio: Denies chest pain, palpitations, irregular heartbeat, syncope, dyspnea, diaphoresis, orthopnea, PND, claudication or edema Respiratory: denies cough, dyspnea, DOE, pleurisy, hoarseness, laryngitis or wheezing.  Gastrointestinal: Denies dysphagia, heartburn, reflux, water brash, pain, cramps, nausea, vomiting, bloating, diarrhea, constipation, hematemesis, melena, hematochezia, jaundice or hemorrhoids Genitourinary: Denies dysuria, frequency, urgency, nocturia, hesitancy, discharge, hematuria or flank pain Musculoskeletal: Denies arthralgia, myalgia, stiffness, Jt. Swelling, pain, limp or strain/sprain. Denies Falls. Skin: Denies puritis, rash, hives, warts, acne, eczema or change in skin lesion Neuro: No weakness, tremor, incoordination, spasms, paresthesia or pain Psychiatric: Denies confusion, memory loss or sensory loss. Denies Depression. Endocrine: Denies change in weight, skin, hair change, nocturia, and paresthesia, diabetic polys, visual blurring or hyper / hypo glycemic episodes.  Heme/Lymph: No excessive bleeding, bruising or enlarged lymph nodes.  Physical Exam  BP 118/84   Pulse 68    Temp (!) 97 F (36.1 C)   Resp 16   Ht 6' (1.829 m)   Wt 206 lb 12.8 oz (93.8 kg)   BMI 28.05 kg/m   General Appearance: Well nourished and well groomed and in no apparent distress.  Eyes: PERRLA, EOMs, conjunctiva no swelling or erythema, normal fundi and vessels. Sinuses: No frontal/maxillary tenderness ENT/Mouth: EACs patent / TMs  nl. Nares clear without erythema, swelling, mucoid exudates. Oral hygiene is good. No erythema, swelling, or exudate. Tongue normal, non-obstructing. Tonsils not swollen or erythematous. Hearing normal.  Neck: Supple, thyroid not palpable. No bruits, nodes or JVD. Respiratory: Respiratory effort normal.  BS equal and clear bilateral without rales, rhonci, wheezing or stridor. Cardio: Heart sounds are normal with regular rate and rhythm and no murmurs, rubs or gallops. Peripheral pulses are normal and equal bilaterally without edema. No aortic or femoral bruits. Chest: symmetric with normal excursions and percussion.  Abdomen: Soft, with Nl bowel sounds. Nontender, no  guarding, rebound, hernias, masses, or organomegaly.  Lymphatics: Non tender without lymphadenopathy.  Musculoskeletal: Full ROM all peripheral extremities, joint stability, 5/5 strength, and normal gait. Skin: Warm and dry without rashes, lesions, cyanosis, clubbing or  ecchymosis.  Neuro: Cranial nerves intact, reflexes equal bilaterally. Normal muscle tone, no cerebellar symptoms. Sensation intact.  Pysch: Alert and oriented X 3 with normal affect, insight and judgment appropriate.   Assessment and Plan  1. Annual Preventative/Screening Exam   2. Elevated BP without diagnosis of hypertension  - EKG 12-Lead - Urinalysis, Routine w reflex microscopic - Microalbumin / Creatinine Urine Ratio - CBC with Diff - COMPLETE METABOLIC PANEL WITH GFR - Magnesium - TSH  3. Hyperlipidemia, mixed  - EKG 12-Lead - Lipid Profile - TSH  4. Abnormal glucose  - EKG 12-Lead - Hemoglobin A1c  ([facility]) - Insulin, random   5. Vitamin D deficiency  - Vitamin D (25 hydroxy)  6. Prediabetes  - EKG 12-Lead - Hemoglobin A1c ([facility]) - Insulin, random  7. Screening for colorectal cancer  - POC Hemoccult Bld/Stl  8. Prostate cancer screening  - PSA  9. Arthralgias  - Sedimentation rate - Anti-DNA antibody, double-stranded - C3 and C4 - Cyclic citrul peptide antibody, IgG - C-reactive protein - Rheumatoid factor - B. burgdorfi antibodies ([facility]) - Anti-Smith antibody - HLA-B27 Antigen  10. Screening examination for pulmonary tuberculosis  - TB Skin Test  11. Screening for ischemic heart disease  - EKG 12-Lead  12. Fatigue  - Iron,Total/Total Iron Binding Cap - Vitamin B12 - Testosterone, Total - CBC with Diff - TSH  13. Medication management  - Urinalysis, Routine w reflex microscopic - Microalbumin / Creatinine Urine Ratio - CBC with Diff - COMPLETE METABOLIC PANEL WITH GFR - Magnesium - Lipid Profile - TSH - Hemoglobin A1c ([facility]) - Insulin, random - Vitamin D (25 hydroxy)        Patient was counseled in prudent diet, weight control to achieve/maintain BMI less than 25, BP monitoring, regular exercise and medications as discussed.  Discussed med effects and SE's. Routine screening labs and tests as requested with regular follow-up as recommended. Over 40 minutes of exam, counseling, chart review and high complex critical decision making was performed   Kirtland Bouchard, MD

## 2019-03-17 ENCOUNTER — Ambulatory Visit (INDEPENDENT_AMBULATORY_CARE_PROVIDER_SITE_OTHER): Payer: BLUE CROSS/BLUE SHIELD | Admitting: Internal Medicine

## 2019-03-17 ENCOUNTER — Other Ambulatory Visit: Payer: Self-pay

## 2019-03-17 VITALS — BP 118/84 | HR 68 | Temp 97.0°F | Resp 16 | Ht 72.0 in | Wt 206.8 lb

## 2019-03-17 DIAGNOSIS — R030 Elevated blood-pressure reading, without diagnosis of hypertension: Secondary | ICD-10-CM | POA: Diagnosis not present

## 2019-03-17 DIAGNOSIS — Z130 Encounter for screening for diseases of the blood and blood-forming organs and certain disorders involving the immune mechanism: Secondary | ICD-10-CM | POA: Diagnosis not present

## 2019-03-17 DIAGNOSIS — R350 Frequency of micturition: Secondary | ICD-10-CM | POA: Diagnosis not present

## 2019-03-17 DIAGNOSIS — Z1211 Encounter for screening for malignant neoplasm of colon: Secondary | ICD-10-CM

## 2019-03-17 DIAGNOSIS — Z131 Encounter for screening for diabetes mellitus: Secondary | ICD-10-CM

## 2019-03-17 DIAGNOSIS — R5383 Other fatigue: Secondary | ICD-10-CM

## 2019-03-17 DIAGNOSIS — E559 Vitamin D deficiency, unspecified: Secondary | ICD-10-CM | POA: Diagnosis not present

## 2019-03-17 DIAGNOSIS — Z125 Encounter for screening for malignant neoplasm of prostate: Secondary | ICD-10-CM | POA: Diagnosis not present

## 2019-03-17 DIAGNOSIS — Z111 Encounter for screening for respiratory tuberculosis: Secondary | ICD-10-CM

## 2019-03-17 DIAGNOSIS — Z1389 Encounter for screening for other disorder: Secondary | ICD-10-CM

## 2019-03-17 DIAGNOSIS — Z79899 Other long term (current) drug therapy: Secondary | ICD-10-CM | POA: Diagnosis not present

## 2019-03-17 DIAGNOSIS — Z13220 Encounter for screening for lipoid disorders: Secondary | ICD-10-CM | POA: Diagnosis not present

## 2019-03-17 DIAGNOSIS — Z1329 Encounter for screening for other suspected endocrine disorder: Secondary | ICD-10-CM | POA: Diagnosis not present

## 2019-03-17 DIAGNOSIS — Z136 Encounter for screening for cardiovascular disorders: Secondary | ICD-10-CM

## 2019-03-17 DIAGNOSIS — M2550 Pain in unspecified joint: Secondary | ICD-10-CM

## 2019-03-17 DIAGNOSIS — Z0001 Encounter for general adult medical examination with abnormal findings: Secondary | ICD-10-CM

## 2019-03-17 DIAGNOSIS — Z0000 Encounter for general adult medical examination without abnormal findings: Secondary | ICD-10-CM

## 2019-03-17 DIAGNOSIS — R7303 Prediabetes: Secondary | ICD-10-CM

## 2019-03-17 DIAGNOSIS — N401 Enlarged prostate with lower urinary tract symptoms: Secondary | ICD-10-CM | POA: Diagnosis not present

## 2019-03-17 DIAGNOSIS — E782 Mixed hyperlipidemia: Secondary | ICD-10-CM

## 2019-03-17 DIAGNOSIS — R7309 Other abnormal glucose: Secondary | ICD-10-CM

## 2019-03-17 MED ORDER — SILDENAFIL CITRATE 100 MG PO TABS: ORAL_TABLET | ORAL | 3 refills | Status: DC

## 2019-03-17 MED ORDER — SILDENAFIL CITRATE 20 MG PO TABS: ORAL_TABLET | ORAL | 3 refills | Status: DC

## 2019-03-18 LAB — COMPLETE METABOLIC PANEL WITH GFR
AG Ratio: 1.6 (calc) (ref 1.0–2.5)
ALT: 16 U/L (ref 9–46)
AST: 19 U/L (ref 10–40)
Albumin: 4.5 g/dL (ref 3.6–5.1)
Alkaline phosphatase (APISO): 46 U/L (ref 36–130)
BUN: 14 mg/dL (ref 7–25)
CO2: 25 mmol/L (ref 20–32)
Calcium: 9.2 mg/dL (ref 8.6–10.3)
Chloride: 103 mmol/L (ref 98–110)
Creat: 1.11 mg/dL (ref 0.60–1.35)
GFR, Est African American: 91 mL/min/{1.73_m2} (ref >=60)
GFR, Est Non African American: 79 mL/min/{1.73_m2} (ref >=60)
Globulin: 2.8 g/dL (calc) (ref 1.9–3.7)
Glucose, Bld: 109 mg/dL — ABNORMAL HIGH (ref 65–99)
Potassium: 3.9 mmol/L (ref 3.5–5.3)
Sodium: 138 mmol/L (ref 135–146)
Total Bilirubin: 0.6 mg/dL (ref 0.2–1.2)
Total Protein: 7.3 g/dL (ref 6.1–8.1)

## 2019-03-18 LAB — LIPID PANEL
Cholesterol: 244 mg/dL — ABNORMAL HIGH (ref <200)
HDL: 42 mg/dL (ref >=40)
LDL Cholesterol (Calc): 144 mg/dL (calc) — ABNORMAL HIGH
Non-HDL Cholesterol (Calc): 202 mg/dL (calc) — ABNORMAL HIGH (ref <130)
Total CHOL/HDL Ratio: 5.8 (calc) — ABNORMAL HIGH (ref <5.0)
Triglycerides: 379 mg/dL — ABNORMAL HIGH (ref <150)

## 2019-03-18 LAB — MICROALBUMIN / CREATININE URINE RATIO
Creatinine, Urine: 114 mg/dL (ref 20–320)
Microalb Creat Ratio: 2 mcg/mg creat (ref <30)
Microalb, Ur: 0.2 mg/dL

## 2019-03-18 LAB — CBC WITH DIFFERENTIAL/PLATELET
Absolute Monocytes: 444 cells/uL (ref 200–950)
Basophils Absolute: 60 cells/uL (ref 0–200)
Basophils Relative: 1 %
Eosinophils Absolute: 330 cells/uL (ref 15–500)
Eosinophils Relative: 5.5 %
HCT: 43.7 % (ref 38.5–50.0)
Hemoglobin: 15.4 g/dL (ref 13.2–17.1)
Lymphs Abs: 2148 cells/uL (ref 850–3900)
MCH: 32.6 pg (ref 27.0–33.0)
MCHC: 35.2 g/dL (ref 32.0–36.0)
MCV: 92.4 fL (ref 80.0–100.0)
MPV: 10.4 fL (ref 7.5–12.5)
Monocytes Relative: 7.4 %
Neutro Abs: 3018 cells/uL (ref 1500–7800)
Neutrophils Relative %: 50.3 %
Platelets: 235 10*3/uL (ref 140–400)
RBC: 4.73 10*6/uL (ref 4.20–5.80)
RDW: 11.9 % (ref 11.0–15.0)
Total Lymphocyte: 35.8 %
WBC: 6 10*3/uL (ref 3.8–10.8)

## 2019-03-18 LAB — MAGNESIUM: Magnesium: 2.2 mg/dL (ref 1.5–2.5)

## 2019-03-18 LAB — VITAMIN D 25 HYDROXY (VIT D DEFICIENCY, FRACTURES): Vit D, 25-Hydroxy: 41 ng/mL (ref 30–100)

## 2019-03-18 LAB — TSH: TSH: 1.37 mIU/L (ref 0.40–4.50)

## 2019-03-18 LAB — ANTI-DNA ANTIBODY, DOUBLE-STRANDED: ds DNA Ab: <1 IU/mL

## 2019-03-18 LAB — URINALYSIS, ROUTINE W REFLEX MICROSCOPIC
Bilirubin Urine: NEGATIVE
Glucose, UA: NEGATIVE
Hgb urine dipstick: NEGATIVE
Ketones, ur: NEGATIVE
Leukocytes,Ua: NEGATIVE
Nitrite: NEGATIVE
Protein, ur: NEGATIVE
Specific Gravity, Urine: 1.016 (ref 1.001–1.03)
pH: 5.5 (ref 5.0–8.0)

## 2019-03-18 LAB — CYCLIC CITRUL PEPTIDE ANTIBODY, IGG: Cyclic Citrullin Peptide Ab: <16 UNITS

## 2019-03-18 LAB — ANTI-SMITH ANTIBODY: ENA SM Ab Ser-aCnc: <1 AI

## 2019-03-18 LAB — HLA-B27 ANTIGEN: HLA-B27 Antigen: NEGATIVE

## 2019-03-18 LAB — INSULIN, RANDOM: Insulin: 28.8 u[IU]/mL — ABNORMAL HIGH

## 2019-03-18 LAB — TESTOSTERONE: Testosterone: 384 ng/dL (ref 250–827)

## 2019-03-18 LAB — PSA: PSA: 0.3 ng/mL (ref <=4.0)

## 2019-03-18 LAB — IRON,?TOTAL/TOTAL IRON BINDING CAP
%SAT: 43 % (calc) (ref 20–48)
TIBC: 336 mcg/dL (calc) (ref 250–425)

## 2019-03-18 LAB — C-REACTIVE PROTEIN: CRP: 2.2 mg/L (ref <8.0)

## 2019-03-18 LAB — SEDIMENTATION RATE: Sed Rate: 9 mm/h (ref 0–15)

## 2019-03-18 LAB — IRON, TOTAL/TOTAL IRON BINDING CAP: Iron: 143 ug/dL (ref 50–180)

## 2019-03-18 LAB — B. BURGDORFI ANTIBODIES: B burgdorferi Ab IgG+IgM: <0.9 index

## 2019-03-18 LAB — C3 AND C4
C3 Complement: 131 mg/dL (ref 82–185)
C4 Complement: 36 mg/dL (ref 15–53)

## 2019-03-18 LAB — HEMOGLOBIN A1C
Hgb A1c MFr Bld: 5.5 % of total Hgb (ref <5.7)
Mean Plasma Glucose: 111 (calc)
eAG (mmol/L): 6.2 (calc)

## 2019-03-18 LAB — RHEUMATOID FACTOR: Rhuematoid fact SerPl-aCnc: <14 IU/mL (ref <14)

## 2019-03-18 LAB — VITAMIN B12: Vitamin B-12: 480 pg/mL (ref 200–1100)

## 2019-03-19 ENCOUNTER — Encounter: Payer: Self-pay | Admitting: Internal Medicine

## 2019-03-19 ENCOUNTER — Other Ambulatory Visit: Payer: Self-pay | Admitting: Internal Medicine

## 2019-03-19 MED ORDER — ROSUVASTATIN CALCIUM 20 MG PO TABS: ORAL_TABLET | ORAL | 3 refills | Status: DC

## 2019-10-05 ENCOUNTER — Telehealth: Payer: Self-pay | Admitting: Cardiology

## 2019-10-05 NOTE — Telephone Encounter (Signed)
I spoke with patient on 10/05/19 to schedule follow up visit with Dr. Cristal Deer from patients recall list. The patient stated they did not want follow up. They feel as if their blood work is the only thing being followed up, no meds are being prescribed and they can just go to their internal doctor for that and its pointless to keep coming back.

## 2019-10-20 ENCOUNTER — Encounter: Payer: BLUE CROSS/BLUE SHIELD | Admitting: Internal Medicine

## 2019-11-04 ENCOUNTER — Other Ambulatory Visit: Payer: Self-pay | Admitting: Internal Medicine

## 2019-12-28 ENCOUNTER — Other Ambulatory Visit: Payer: Self-pay

## 2019-12-28 ENCOUNTER — Ambulatory Visit (INDEPENDENT_AMBULATORY_CARE_PROVIDER_SITE_OTHER): Payer: BC Managed Care – PPO | Admitting: Internal Medicine

## 2019-12-28 VITALS — BP 104/76 | HR 76 | Temp 97.0°F | Resp 16 | Ht 72.0 in | Wt 209.4 lb

## 2019-12-28 DIAGNOSIS — R519 Headache, unspecified: Secondary | ICD-10-CM

## 2019-12-28 DIAGNOSIS — R110 Nausea: Secondary | ICD-10-CM

## 2019-12-28 DIAGNOSIS — E782 Mixed hyperlipidemia: Secondary | ICD-10-CM | POA: Diagnosis not present

## 2019-12-28 DIAGNOSIS — M791 Myalgia, unspecified site: Secondary | ICD-10-CM

## 2019-12-28 DIAGNOSIS — R509 Fever, unspecified: Secondary | ICD-10-CM

## 2019-12-28 DIAGNOSIS — Z79899 Other long term (current) drug therapy: Secondary | ICD-10-CM

## 2019-12-28 MED ORDER — DEXAMETHASONE 4 MG PO TABS: ORAL_TABLET | ORAL | 0 refills | Status: DC

## 2019-12-28 NOTE — Progress Notes (Signed)
History of Present Illness:      Patient is a very nice 49 yo DWM with hx/o elevated BPin the past, HLD, who is a Environmental manager Drug [employer] who has no prior hx/o Oovid 19  Illness who  had his 1st   Pfizer Cov19 vaccination 8 days ago. He relates almost immediately within 30 minutes developed nausea, indigestion. He continued on with milder sx's for several days, also experiencing extreme fatigue. Then 3 days ago he developed a frontal & nuchal HA. He had a negative Rapid Cov19 test 3 days ago and had a 2sd test today (pending). Also reports that he had fever, chills and denies any cough, congestion, rash or UT sx's.   Medications  .  sildenafil (REVATIO) 20 MG tablet, Take 1 to 5 tablets daily as needed for XXXX .  sildenafil (VIAGRA) 100 MG tablet, Take 1 tablet Daily as needed for XXXX .  cetirizine (ZYRTEC ALLERGY) 10 MG tablet, Take 10 mg by mouth daily. Marland Kitchen  acyclovir (ZOVIRAX) 800 MG tablet, Take 1 tablet 4 x /day  as needed for Herpes .  Cholecalciferol (VITAMIN D3) 5000 UNIT/ML LIQD, Take by mouth daily. .  hydrocortisone (ANUSOL-HC) 2.5 % rectal cream, Place 1 application rectally 4 (four) times daily. Apply rectally  2 to 4 x /day as needed for Rectal Irritation  Apple cider 500 mg capsule daily. .  Red Yeast Rice Extract (RED YEAST RICE PO), Take by mouth. Takes 4 capsules per week. .  zinc gluconate 50 MG tablet, Take 50 mg by mouth daily.  Problem list He has Paresthesias; Hyperlipidemia; ADD (attention deficit disorder); Elevated BP; Abnormal glucose; Medication management; Vitamin D deficiency; Sprain of calcaneofibular ligament of right ankle; and Impingement syndrome of right shoulder on their problem list.   Observations/Objective:   BP 104/76   Pulse 76   Temp (!) 97 F (36.1 C)   Resp 16   Ht 6' (1.829 m)   Wt 209 lb 6.4 oz (95 kg)   BMI 28.40 kg/m   HEENT - WNL. EAC's / TM's Nl . N/O/P - clear. No Frontal / max tenderness Neck - supple. No palp LN's.  Chest -  Clear equal BS. Cor - Nl HS. RRR w/o sig MGR. PP 1(+). No edema. MS- FROM w/o deformities.  Gait Nl. Neuro -  Nl w/o focal abnormalities. Skin - Clear w/o rash.  Rapid flu test - Negative   Assessment and Plan:  1. Fever, unspecified cause  - dexamethasone  4 MG tablet; Take 1 tab 3 x day - 2 days, then 2 x day - 2 days, then 1 tab daily  Dispense: 13 tablet  - COMPLETE METABOLIC PANEL WITH GFR - CBC with Differential/Platelet  - POC Influenza A&B(BINAX/QUICKVUE) - Negative  2. Myalgia  - dexamethasone  4 MG tablet; Take 1 tab 3 x day - 2 days, then 2 x day - 2 days, then 1 tab daily  Dispense: 13 tablet;   - COMPLETE METABOLIC PANEL WITH GFR - CBC with Differential/Platelet  - POC Influenza A&B(BINAX/QUICKVUE) - Negative  3. Nausea  - COMPLETE METABOLIC PANEL WITH GFR - CBC with Differential/Platelet   4. Frontal headache  - dexamethasone  4 MG tablet; Take 1 tab 3 x day - 2 days, then 2 x day - 2 days, then 1 tab daily  Dispense: 13 tablet  - COMPLETE METABOLIC PANEL WITH GFR - CBC with Differential/Platelet   5. Hyperlipidemia, mixed  - Lipid panel  6. Medication  management  - Lipid panel    Follow Up Instructions:      Discussed with patient , suspect he had/has a vaccine reaction. If his 2sd rapid Cov19 test today return Negative tomorrow, then advised him to repeat again in 3 -4 more days & report results.           I discussed the assessment and treatment plan with the patient. The patient was provided an opportunity to ask questions and all were answered. The patient agreed with the plan and demonstrated an understanding of the instructions.       The patient was advised to call back or seek an in-person evaluation if the symptoms worsen or if the condition fails to improve as anticipated.   Marinus Maw, MD

## 2019-12-29 ENCOUNTER — Other Ambulatory Visit: Payer: Self-pay | Admitting: Internal Medicine

## 2019-12-29 ENCOUNTER — Encounter: Payer: Self-pay | Admitting: Internal Medicine

## 2019-12-29 DIAGNOSIS — E782 Mixed hyperlipidemia: Secondary | ICD-10-CM

## 2019-12-29 LAB — CBC WITH DIFFERENTIAL/PLATELET
Absolute Monocytes: 414 cells/uL (ref 200–950)
Basophils Absolute: 42 cells/uL (ref 0–200)
Basophils Relative: 0.9 %
Eosinophils Absolute: 80 cells/uL (ref 15–500)
Eosinophils Relative: 1.7 %
HCT: 44.2 % (ref 38.5–50.0)
Hemoglobin: 15.1 g/dL (ref 13.2–17.1)
Lymphs Abs: 1184 cells/uL (ref 850–3900)
MCH: 31.5 pg (ref 27.0–33.0)
MCHC: 34.2 g/dL (ref 32.0–36.0)
MCV: 92.1 fL (ref 80.0–100.0)
MPV: 10.3 fL (ref 7.5–12.5)
Monocytes Relative: 8.8 %
Neutro Abs: 2980 cells/uL (ref 1500–7800)
Neutrophils Relative %: 63.4 %
Platelets: 168 10*3/uL (ref 140–400)
RBC: 4.8 10*6/uL (ref 4.20–5.80)
RDW: 11.6 % (ref 11.0–15.0)
Total Lymphocyte: 25.2 %
WBC: 4.7 10*3/uL (ref 3.8–10.8)

## 2019-12-29 LAB — COMPLETE METABOLIC PANEL WITH GFR
AG Ratio: 1.6 (calc) (ref 1.0–2.5)
ALT: 22 U/L (ref 9–46)
AST: 23 U/L (ref 10–40)
Albumin: 4.4 g/dL (ref 3.6–5.1)
Alkaline phosphatase (APISO): 52 U/L (ref 36–130)
BUN: 12 mg/dL (ref 7–25)
CO2: 27 mmol/L (ref 20–32)
Calcium: 9.1 mg/dL (ref 8.6–10.3)
Chloride: 104 mmol/L (ref 98–110)
Creat: 1.13 mg/dL (ref 0.60–1.35)
GFR, Est African American: 89 mL/min/{1.73_m2} (ref >=60)
GFR, Est Non African American: 76 mL/min/{1.73_m2} (ref >=60)
Globulin: 2.8 g/dL (calc) (ref 1.9–3.7)
Glucose, Bld: 99 mg/dL (ref 65–99)
Potassium: 3.9 mmol/L (ref 3.5–5.3)
Sodium: 139 mmol/L (ref 135–146)
Total Bilirubin: 0.4 mg/dL (ref 0.2–1.2)
Total Protein: 7.2 g/dL (ref 6.1–8.1)

## 2019-12-29 LAB — LIPID PANEL
Cholesterol: 201 mg/dL — ABNORMAL HIGH (ref <200)
HDL: 35 mg/dL — ABNORMAL LOW (ref >=40)
LDL Cholesterol (Calc): 136 mg/dL (calc) — ABNORMAL HIGH
Non-HDL Cholesterol (Calc): 166 mg/dL (calc) — ABNORMAL HIGH (ref <130)
Total CHOL/HDL Ratio: 5.7 (calc) — ABNORMAL HIGH (ref <5.0)
Triglycerides: 164 mg/dL — ABNORMAL HIGH (ref <150)

## 2019-12-29 LAB — POC INFLUENZA A&B (BINAX/QUICKVUE)
Influenza A, POC: NEGATIVE
Influenza B, POC: NEGATIVE

## 2019-12-29 MED ORDER — NEXLIZET 180-10 MG PO TABS: ORAL_TABLET | ORAL | 1 refills | Status: DC

## 2019-12-29 NOTE — Progress Notes (Signed)
========================================================== -   Test results slightly outside the reference range are not unusual. If there is anything important, I will review this with you,  otherwise it is considered normal test values.  If you have further questions,  please do not hesitate to contact me at the office or via My Chart.  ==========================================================  -  Total Chol - slightly better, but still elevated  (Ideal or Goal is less than 180)   - and   - Bad / Dangerous LDL Chol = 136 - too high  - Sent in Rx for Nexlizet - a new non-Statin medication for your Chol   - Pending on your insurance Company  - May have to do a prior authorization to get your  co-pay down to $10 /90 days  ==========================================================  -  CBC - Normal WBC & Red cell count  - All Else - Kidneys - Electrolytes - all  Normal / OK ==========================================================   - With the 2sd Covid test - Negative - more consistent with a  vaccine reaction as Marcus Jones diagnosed  ==========================================================

## 2019-12-29 NOTE — Progress Notes (Signed)
========================================================== -   Test results slightly outside the reference range are not unusual. If there is anything important, I will review this with you,  otherwise it is considered normal test values.  If you have further questions,  please do not hesitate to contact me at the office or via My Chart.  ==========================================================  -  Total Chol - slightly better, but still elevated  (Ideal or Goal is less than 180)   - and   - Bad / Dangerous LDL Chol = 136 - too high  - Sent in Rx for Nexlizet - a new non-Statin medication for your Chol   - Pending on your insurance Company  - May have to do a prior authorization to get your  co-pay down to $10 /90 days  ==========================================================  -  CBC - Normal WBC & Red cell count  - All Else - Kidneys - Electrolytes - all  Normal / OK ==========================================================   - with the 2sd Covid test - Negative - more consistent with a  vaccine reaction as Marcus Jones diagnosed  ==========================================================

## 2020-01-02 ENCOUNTER — Other Ambulatory Visit: Payer: Self-pay | Admitting: Internal Medicine

## 2020-01-02 DIAGNOSIS — U071 COVID-19: Secondary | ICD-10-CM

## 2020-01-02 DIAGNOSIS — R509 Fever, unspecified: Secondary | ICD-10-CM

## 2020-01-02 DIAGNOSIS — M791 Myalgia, unspecified site: Secondary | ICD-10-CM

## 2020-01-02 DIAGNOSIS — R519 Headache, unspecified: Secondary | ICD-10-CM

## 2020-01-02 MED ORDER — DEXAMETHASONE 4 MG PO TABS: ORAL_TABLET | ORAL | 0 refills | Status: DC

## 2020-01-03 ENCOUNTER — Ambulatory Visit (HOSPITAL_COMMUNITY)
Admission: RE | Admit: 2020-01-03 | Discharge: 2020-01-03 | Disposition: A | Discharge status: Home / Self Care | Payer: BC Managed Care – PPO | Source: Ambulatory Visit | Attending: Pulmonary Disease | Admitting: Pulmonary Disease

## 2020-01-03 ENCOUNTER — Other Ambulatory Visit: Payer: Self-pay | Admitting: Internal Medicine

## 2020-01-03 DIAGNOSIS — E663 Overweight: Secondary | ICD-10-CM | POA: Insufficient documentation

## 2020-01-03 DIAGNOSIS — U071 COVID-19: Secondary | ICD-10-CM | POA: Insufficient documentation

## 2020-01-03 MED ORDER — FAMOTIDINE IN NACL 20-0.9 MG/50ML-% IV SOLN: 20.0000 mg | Freq: Once | INTRAVENOUS | Status: DC | PRN

## 2020-01-03 MED ORDER — ALBUTEROL SULFATE HFA 108 (90 BASE) MCG/ACT IN AERS: 2.0000 | INHALATION_SPRAY | Freq: Once | RESPIRATORY_TRACT | Status: DC | PRN

## 2020-01-03 MED ORDER — SODIUM CHLORIDE 0.9 % IV SOLN
1200.0000 mg | Freq: Once | INTRAVENOUS | Status: AC
  Administered 2020-01-03: 1200 mg via INTRAVENOUS
  Filled 2020-01-03: qty 10

## 2020-01-03 MED ORDER — EPINEPHRINE 0.3 MG/0.3ML IJ SOAJ: 0.3000 mg | Freq: Once | INTRAMUSCULAR | Status: DC | PRN

## 2020-01-03 MED ORDER — SODIUM CHLORIDE 0.9 % IV SOLN: INTRAVENOUS | Status: DC | PRN

## 2020-01-03 MED ORDER — METHYLPREDNISOLONE SODIUM SUCC 125 MG IJ SOLR: 125.0000 mg | Freq: Once | INTRAMUSCULAR | Status: DC | PRN

## 2020-01-03 MED ORDER — DIPHENHYDRAMINE HCL 50 MG/ML IJ SOLN: 50.0000 mg | Freq: Once | INTRAMUSCULAR | Status: DC | PRN

## 2020-01-03 NOTE — Discharge Instructions (Signed)

## 2020-01-03 NOTE — Progress Notes (Signed)
  Diagnosis: COVID-19  Physician: Dr. Wright  Procedure: [facility] Med: casirivimab\imdevimab infusion - Provided patient with casirivimab\imdevimab fact sheet for patients, parents and caregivers prior to infusion.  Complications: No immediate complications noted.  Discharge: Discharged home   Marcus Jones 01/03/2020   

## 2020-01-16 ENCOUNTER — Emergency Department (HOSPITAL_BASED_OUTPATIENT_CLINIC_OR_DEPARTMENT_OTHER): Payer: BC Managed Care – PPO

## 2020-01-16 ENCOUNTER — Other Ambulatory Visit: Payer: Self-pay

## 2020-01-16 ENCOUNTER — Encounter (HOSPITAL_BASED_OUTPATIENT_CLINIC_OR_DEPARTMENT_OTHER): Payer: Self-pay | Admitting: *Deleted

## 2020-01-16 ENCOUNTER — Telehealth: Payer: Self-pay | Admitting: Internal Medicine

## 2020-01-16 ENCOUNTER — Emergency Department (HOSPITAL_BASED_OUTPATIENT_CLINIC_OR_DEPARTMENT_OTHER)
Admission: EM | Admit: 2020-01-16 | Discharge: 2020-01-16 | Disposition: A | Discharge status: Home / Self Care | Payer: BC Managed Care – PPO | Attending: Emergency Medicine | Admitting: Emergency Medicine

## 2020-01-16 DIAGNOSIS — R05 Cough: Secondary | ICD-10-CM | POA: Diagnosis present

## 2020-01-16 DIAGNOSIS — Z8616 Personal history of COVID-19: Secondary | ICD-10-CM | POA: Diagnosis not present

## 2020-01-16 DIAGNOSIS — R519 Headache, unspecified: Secondary | ICD-10-CM | POA: Insufficient documentation

## 2020-01-16 DIAGNOSIS — R0602 Shortness of breath: Secondary | ICD-10-CM | POA: Diagnosis not present

## 2020-01-16 DIAGNOSIS — M545 Low back pain: Secondary | ICD-10-CM | POA: Insufficient documentation

## 2020-01-16 DIAGNOSIS — M791 Myalgia, unspecified site: Secondary | ICD-10-CM | POA: Insufficient documentation

## 2020-01-16 DIAGNOSIS — F909 Attention-deficit hyperactivity disorder, unspecified type: Secondary | ICD-10-CM | POA: Insufficient documentation

## 2020-01-16 DIAGNOSIS — J189 Pneumonia, unspecified organism: Secondary | ICD-10-CM

## 2020-01-16 DIAGNOSIS — R110 Nausea: Secondary | ICD-10-CM | POA: Diagnosis not present

## 2020-01-16 LAB — CBC WITH DIFFERENTIAL/PLATELET
Abs Immature Granulocytes: 0.02 10*3/uL (ref 0.00–0.07)
Basophils Absolute: 0 10*3/uL (ref 0.0–0.1)
Basophils Relative: 0 %
Eosinophils Absolute: 0.1 10*3/uL (ref 0.0–0.5)
Eosinophils Relative: 1 %
HCT: 43 % (ref 39.0–52.0)
Hemoglobin: 14.4 g/dL (ref 13.0–17.0)
Immature Granulocytes: 0 %
Lymphocytes Relative: 19 %
Lymphs Abs: 1.5 10*3/uL (ref 0.7–4.0)
MCH: 31.6 pg (ref 26.0–34.0)
MCHC: 33.5 g/dL (ref 30.0–36.0)
MCV: 94.3 fL (ref 80.0–100.0)
Monocytes Absolute: 0.6 10*3/uL (ref 0.1–1.0)
Monocytes Relative: 7 %
Neutro Abs: 5.8 10*3/uL (ref 1.7–7.7)
Neutrophils Relative %: 73 %
Platelets: 280 10*3/uL (ref 150–400)
RBC: 4.56 MIL/uL (ref 4.22–5.81)
RDW: 11.9 % (ref 11.5–15.5)
WBC: 8 10*3/uL (ref 4.0–10.5)
nRBC: 0 % (ref 0.0–0.2)

## 2020-01-16 LAB — COMPREHENSIVE METABOLIC PANEL
ALT: 98 U/L — ABNORMAL HIGH (ref 0–44)
AST: 40 U/L (ref 15–41)
Albumin: 3.4 g/dL — ABNORMAL LOW (ref 3.5–5.0)
Alkaline Phosphatase: 50 U/L (ref 38–126)
Anion gap: 11 (ref 5–15)
BUN: 17 mg/dL (ref 6–20)
CO2: 27 mmol/L (ref 22–32)
Calcium: 8.8 mg/dL — ABNORMAL LOW (ref 8.9–10.3)
Chloride: 101 mmol/L (ref 98–111)
Creatinine, Ser: 1.05 mg/dL (ref 0.61–1.24)
GFR calc Af Amer: >60 mL/min (ref >60)
GFR calc non Af Amer: >60 mL/min (ref >60)
Glucose, Bld: 90 mg/dL (ref 70–99)
Potassium: 4.1 mmol/L (ref 3.5–5.1)
Sodium: 139 mmol/L (ref 135–145)
Total Bilirubin: 0.6 mg/dL (ref 0.3–1.2)
Total Protein: 7.3 g/dL (ref 6.5–8.1)

## 2020-01-16 LAB — TROPONIN I (HIGH SENSITIVITY): Troponin I (High Sensitivity): 2 ng/L (ref <18)

## 2020-01-16 LAB — LACTIC ACID, PLASMA: Lactic Acid, Venous: 1.6 mmol/L (ref 0.5–1.9)

## 2020-01-16 MED ORDER — AZITHROMYCIN 250 MG PO TABS: 250.0000 mg | ORAL_TABLET | Freq: Every day | ORAL | 0 refills | Status: DC

## 2020-01-16 MED ORDER — IOHEXOL 350 MG/ML SOLN
100.0000 mL | Freq: Once | INTRAVENOUS | Status: AC | PRN
  Administered 2020-01-16: 100 mL via INTRAVENOUS

## 2020-01-16 MED FILL — AZITHROMYCIN 250 MG TABLET: 250 | 5 days supply | Qty: 6 | Fill #0

## 2020-01-16 NOTE — ED Notes (Signed)
PT ambulated steady without assistance, PTs O2 Remained above 97%

## 2020-01-16 NOTE — ED Triage Notes (Signed)
NAD.  Pulse ox 97%, HR 96

## 2020-01-16 NOTE — ED Provider Notes (Signed)
[facility] EMERGENCY DEPARTMENT Provider Note   CSN: [id number] Arrival date & time: 01/16/20  1048     History No chief complaint on file.   Marcus Jones is a 49 y.o. male.  HPI      12/21/2019 had vaccine, felt nausea after leaving the pharmacy and had extreme nausea 25-30--night of 30 fever, flu like symptoms Cough, abdominal cramping, constant headache, sore throat today but ok now Body aches, all over, lower back pain like never had before No energy Shortness of breath for the last few days, fatigue, short of breath with minimal activity Still coughing, clear but today was yellow phlegm Nausea has improved  8/31 symptomatic 9/4 positive for COVID 19 Brother had pneumonia, blood clots with COVID Better half is [place] Family hx of PE in dad and brother Received MAB  Normally is active but has been unable to do anything for the last 20 days Last night   Fathers day had allergic reaction to shrimp, had epi pen, last night felt like that, nausea/vomiting/diaphoresis, hives all over back and chest, coughing uncontrollably, burning in chest. No known triggers last night.   Past Medical History:  Diagnosis Date  . ADD (attention deficit disorder)   . Allergy   . Arthritis   . Family history of malignant neoplasm of gastrointestinal tract   . Hyperlipemia     Patient Active Problem List   Diagnosis Date Noted  . Impingement syndrome of right shoulder 03/24/2016  . Sprain of calcaneofibular ligament of right ankle 03/04/2016  . Elevated BP 08/09/2014  . Abnormal glucose 08/09/2014  . Medication management 08/09/2014  . Vitamin D deficiency 08/09/2014  . Hyperlipidemia 06/29/2013  . ADD (attention deficit disorder) 06/29/2013  . Paresthesias 02/02/2013    Past Surgical History:  Procedure Laterality Date  . NASAL FRACTURE SURGERY    . TONSILLECTOMY    . WISDOM TOOTH EXTRACTION         Family History  Problem Relation Age of Onset  .  Colon polyps Mother   . Colon cancer Father        x3  . Clotting disorder Father   . Prostate cancer Brother   . Clotting disorder Brother     Social History   Tobacco Use  . Smoking status: Never Smoker  . Smokeless tobacco: Never Used  Substance Use Topics  . Alcohol use: Yes    Comment: 1-2 weekl   . Drug use: No    Home Medications Prior to Admission medications   Medication Sig Start Date End Date Taking? Authorizing Provider  acyclovir (ZOVIRAX) 800 MG tablet Take 1 tablet 4 x /day  as needed for Herpes 11/05/18   Lucky Cowboy, MD  azithromycin (ZITHROMAX) 250 MG tablet Take 1 tablet (250 mg total) by mouth daily. Take first 2 tablets together, then 1 every day until finished. 01/16/20   Alvira Monday, MD  Bempedoic Acid-Ezetimibe (NEXLIZET) 180-10 MG TABS Take 1 tablet    Daily     for Cholesterol 12/29/19   Lucky Cowboy, MD  cetirizine (ZYRTEC ALLERGY) 10 MG tablet Take 10 mg by mouth daily.    [provider]  Cholecalciferol (VITAMIN D3) 5000 UNIT/ML LIQD Take by mouth daily.    [provider]  dexamethasone (DECADRON) 4 MG tablet Take 1 tab 3 x day - 3 days, then 2 x day - 3 days, then 1 tab daily 01/02/20   Lucky Cowboy, MD  hydrocortisone (ANUSOL-HC) 2.5 % rectal cream Place  1 application rectally 4 (four) times daily. Apply rectally  2 to 4 x /day as needed for Rectal Irritation 11/05/18   Lucky Cowboy, MD  OVER THE COUNTER MEDICATION Apple cider 500 mg capsule daily.    [provider]  Red Yeast Rice Extract (RED YEAST RICE PO) Take by mouth. Takes 4 capsules per week.    [provider]  sildenafil (REVATIO) 20 MG tablet Take 1 to 5 tablets daily as needed for XXXX 03/17/19   Lucky Cowboy, MD  sildenafil (VIAGRA) 100 MG tablet Take 1 tablet Daily as needed for XXXX 11/04/19   Lucky Cowboy, MD  zinc gluconate 50 MG tablet Take 50 mg by mouth daily.    [provider]    Allergies    Pravastatin  sodium, Amoxicillin anhydrous [amoxicillin], Doxycycline, Eggs or egg-derived products, Keflex [cephalexin], Levaquin [levofloxacin], Neomycin, and Sulfa antibiotics  Review of Systems   Review of Systems  Constitutional: Positive for fatigue. Negative for fever.  HENT: Positive for sore throat. Negative for congestion.   Eyes: Negative for visual disturbance.  Respiratory: Positive for cough and shortness of breath.   Cardiovascular: Negative for chest pain.  Gastrointestinal: Positive for abdominal pain, nausea (had improved, until last night) and vomiting (last night).  Genitourinary: Negative for difficulty urinating.  Musculoskeletal: Negative for back pain and neck stiffness.  Skin: Positive for rash (last night over back and chest).  Neurological: Positive for headaches. Negative for syncope.    Physical Exam Updated Vital Signs BP 110/88 (BP Location: Left Arm)   Pulse 83   Temp 98.4 F (36.9 C) (Oral)   Resp 20   Ht 6' (1.829 m)   Wt 95 kg   SpO2 99%   BMI 28.40 kg/m   Physical Exam Vitals and nursing note reviewed.  Constitutional:      General: He is not in acute distress.    Appearance: He is well-developed. He is not diaphoretic.  HENT:     Head: Normocephalic and atraumatic.  Eyes:     Conjunctiva/sclera: Conjunctivae normal.  Cardiovascular:     Rate and Rhythm: Normal rate and regular rhythm.     Heart sounds: Normal heart sounds. No murmur heard.  No friction rub. No gallop.   Pulmonary:     Effort: Pulmonary effort is normal. No respiratory distress.     Breath sounds: Normal breath sounds. No wheezing or rales.  Abdominal:     General: There is no distension.     Palpations: Abdomen is soft.     Tenderness: There is no abdominal tenderness. There is no guarding.  Musculoskeletal:     Cervical back: Normal range of motion.  Skin:    General: Skin is warm and dry.  Neurological:     Mental Status: He is alert and oriented to person, place, and  time.     ED Results / Procedures / Treatments   Labs (all labs ordered are listed, but only abnormal results are displayed) Labs Reviewed  COMPREHENSIVE METABOLIC PANEL - Abnormal; Notable for the following components:      Result Value   Calcium 8.8 (*)    Albumin 3.4 (*)    ALT 98 (*)    All other components within normal limits  CULTURE, BLOOD (ROUTINE X 2)  CULTURE, BLOOD (ROUTINE X 2)  CBC WITH DIFFERENTIAL/PLATELET  LACTIC ACID, PLASMA  TROPONIN I (HIGH SENSITIVITY)  TROPONIN I (HIGH SENSITIVITY)    EKG EKG Interpretation  Date/Time:  Monday January 16 2020 14:42:56 EDT Ventricular Rate:  81 PR Interval:    QRS Duration: 92 QT Interval:  379 QTC Calculation: 440 R Axis:   46 Text Interpretation: Sinus rhythm No previous ECGs available Confirmed by Alvira Monday ([id number]) on 01/16/2020 4:02:15 PM   Radiology CT Angio Chest PE W and/or Wo Contrast  Result Date: 01/16/2020 CLINICAL DATA:  Shortness of breath.  COVID positive. EXAM: CT ANGIOGRAPHY CHEST WITH CONTRAST TECHNIQUE: Multidetector CT imaging of the chest was performed using the standard protocol during bolus administration of intravenous contrast. Multiplanar CT image reconstructions and MIPs were obtained to evaluate the vascular anatomy. CONTRAST:  OMNIPAQUE IOHEXOL 350 MG/ML SOLN COMPARISON:  March 17, 2018 FINDINGS: Cardiovascular: Contrast injection is sufficient to demonstrate satisfactory opacification of the pulmonary arteries to the segmental level. There is no pulmonary embolus or evidence of right heart strain. The size of the main pulmonary artery is normal. Heart size is normal, with no pericardial effusion. The course and caliber of the aorta are normal. There is no atherosclerotic calcification. Opacification decreased due to pulmonary arterial phase contrast bolus timing. Mediastinum/Nodes: -- No mediastinal lymphadenopathy. -- No hilar lymphadenopathy. -- No axillary lymphadenopathy. --  No supraclavicular lymphadenopathy. -- Normal thyroid gland where visualized. -  Unremarkable esophagus. Lungs/Pleura: There are diffuse bilateral ground-glass airspace opacities. There is no pneumothorax. No significant pleural effusion. The trachea is unremarkable. Upper Abdomen: Contrast bolus timing is not optimized for evaluation of the abdominal organs. The visualized portions of the organs of the upper abdomen are normal. Musculoskeletal: No chest wall abnormality. No bony spinal canal stenosis. Review of the MIP images confirms the above findings. IMPRESSION: 1. No evidence of acute pulmonary embolus. 2. Diffuse bilateral ground-glass airspace opacities, consistent with the patient's history of viral pneumonia. Electronically Signed   By: Katherine Mantle M.D.   On: 01/16/2020 15:09   DG Chest Portable 1 View  Result Date: 01/16/2020 CLINICAL DATA:  Shortness of breath, COVID-19 positive. EXAM: PORTABLE CHEST 1 VIEW COMPARISON:  None. FINDINGS: The heart size and mediastinal contours are within normal limits. No pneumothorax or pleural effusion is noted. Patchy airspace opacities are noted throughout both lungs most consistent with multifocal pneumonia. The visualized skeletal structures are unremarkable. IMPRESSION: Bilateral multifocal pneumonia. Electronically Signed   By: Lupita Raider M.D.   On: 01/16/2020 11:58    Procedures Procedures (including critical care time)  Medications Ordered in ED Medications  iohexol (OMNIPAQUE) 350 MG/ML injection 100 mL (100 mLs Intravenous Contrast Given 01/16/20 1452)    ED Course  I have reviewed the triage vital signs and the nursing notes.  Pertinent labs & imaging results that were available during my care of the patient were reviewed by me and considered in my medical decision making (see chart for details).    MDM Rules/Calculators/A&P                           49yo male with history of hyperlipidemia, family history of PE, COVID 16  diagnosed 9/4 presents with concern for continuing fatigue, shortness of breath, body aches. Had episode last night of vomiting, chest burning, hives which resolved.  Differential diagnosis includes pulmonary embolus, dissection, pneumothorax, pneumonia, ACS, myocarditis, pericarditis continued COVID symptoms.  EKG was done and evaluate by me and showed no acute ST changes and no signs of pericarditis. Chest x-ray was done and evaluated by me and radiology and showed multifocal pneumonia.  CT PE study without  PE, with findings concerning for COVID 19/viral pneumonia.  Patient is low risk HEART score and had troponin negative after 6hr of symptoms and doubt ACS, myocarditis.  Do not feel history or exam are consistent with aortic dissection.  Possible continuing symptoms secondary to COVID, however given possibility of bacterial infection as cause of pneumonia will give rx for azithromycin and recommend continued supportive care.    Final Clinical Impression(s) / ED Diagnoses Final diagnoses:  History of COVID-19  Community acquired pneumonia, unspecified laterality    Rx / DC Orders ED Discharge Orders         Ordered    azithromycin (ZITHROMAX) 250 MG tablet  Daily,   Status:  Discontinued        01/16/20 1602    azithromycin (ZITHROMAX) 250 MG tablet  Daily        01/16/20 1614           Alvira MondaySchlossman, Daijah Scrivens, MD 01/16/20 2149

## 2020-01-16 NOTE — Telephone Encounter (Signed)
patient called to report 20 day of Covid. SOB and reduced O2; Last night 85 o2, this morning 90. Per Dr Maryan Puls go to ER for eval and chest xray.

## 2020-01-16 NOTE — ED Triage Notes (Signed)
Covid Positive 3 days ago. C.o SOB.

## 2020-01-18 ENCOUNTER — Other Ambulatory Visit: Payer: Self-pay | Admitting: Internal Medicine

## 2020-01-21 LAB — CULTURE, BLOOD (ROUTINE X 2)
Culture: NO GROWTH
Culture: NO GROWTH
Special Requests: ADEQUATE

## 2020-04-15 ENCOUNTER — Encounter: Payer: Self-pay | Admitting: Internal Medicine

## 2020-04-15 NOTE — Progress Notes (Signed)
Annual  Screening/Preventative Visit  & Comprehensive Evaluation & Examination      This very nice 49 y.o. DWM presents for a Screening /Preventative Visit & comprehensive evaluation and management of multiple medical co-morbidities.  Patient has been followed for HTN, HLD, Prediabetes and Vitamin D Deficiency.  In 2008 a sleep study showed "mild Apnea" & CPAP not indicated.  Patient is followed by Dr Evelene Croon for ADD. Patient relates 2 week hx/o R ankle pains after a jumping injury.      Patient's father had hx/o Colon cancer x 3  & mother has hx/o multiple colon polyps. Patient had Colonoscopy in 2006 at age 72 yo and last colonoscopy was in 2014 recommending 5 yr f/u for surveillance (overdue) .     Patient has been followed since May 2018 for labile HTN.  Patient's BP has been controlled and today's BP is at goal - 116/72. Patient denies any cardiac symptoms as chest pain, palpitations, shortness of breath, dizziness or ankle swelling.      Patient's hyperlipidemia is not controlled with diet and he has intolerance to Statin.  In Sept 2021, he was prescribed Nexlizet which he never picked up as he thought that it was a Statin. He relates recent Insurance Exam labs found elevated TC 243, TG 391 and LDL 143.  Patient denies myalgias or other medication SE's. Last lipids were not at goal:  Lab Results  Component Value Date   CHOL 201 (H) 12/28/2019   HDL 35 (L) 12/28/2019   LDLCALC 136 (H) 12/28/2019   TRIG 164 (H) 12/28/2019   CHOLHDL 5.7 (H) 12/28/2019       Patient has hx/o prediabetes  (A1c 5.9% /2013) and patient denies reactive hypoglycemic symptoms, visual blurring, diabetic polys or paresthesias. Last A1c was Normal :   Lab Results  Component Value Date   HGBA1C 5.5 03/17/2019        Finally, patient has history of Vitamin D Deficiency  ("39" /2013) and last vitamin D was still low:   Lab Results  Component Value Date   VD25OH 41 03/17/2019    Current Outpatient  Medications on File Prior to Visit  Medication Sig  . Acyclovir 800 MG tablet TAKE 1 TABLET 4 TIMES DAILY AS NEEDED.  Marland Kitchen NEXLIZET 180-10 MG TABS Take 1 tablet    Daily     for Cholesterol  . cetirizine  10 MG tablet Take 10 mg  daily.  Marland Kitchen VITAMIN D 5000 UNIT Take  daily.  . ANUSOL-HC 2.5 % rectal crm APPLY RECTALLY    2 TO 4 TIMES DAILY.  Marland Kitchen Apple cider 500 mg capsule  daily.  . Red Yeast Rice Extract Take by mouth. Takes 4 capsules per week.  . sildenafil  100 MG tablet Take 1 tablet Daily as needed for XXXX  . zinc 50 MG tablet Take  daily.    Allergies  Allergen Reactions  . Pravastatin Sodium Neurological side effects in 01/2013  . Amoxicillin Anhydrous    . Doxycycline   . Eggs Or Egg-Derived Products   . Keflex [Cephalexin]   . Levaquin [Levofloxacin]   . Neomycin   . Sulfa Antibiotics     Past Medical History:  Diagnosis Date  . ADD (attention deficit disorder)   . Allergy   . Arthritis   . Family history of malignant neoplasm of gastrointestinal tract   . Hyperlipemia    Health Maintenance  Topic Date Due  . COVID-19 Vaccine (1) Never done  .  COLONOSCOPY  10/04/2017  . INFLUENZA VACCINE  Never done  . TETANUS/TDAP  10/16/2028  . Hepatitis C Screening  Completed  . HIV Screening  Completed   Immunization History  Administered Date(s) Administered  . PPD Test 06/29/2013, 08/09/2014, 08/15/2015, 09/02/2016, 09/18/2017, 03/17/2019  . Td 04/28/2008  . Tdap 10/17/2018   Last Colon - 10/04/2012 - Dr Elisabeth Most - Recc 5 year f/u due June 2019 - patient aware he needs to reschedule  Past Surgical History:  Procedure Laterality Date  . NASAL FRACTURE SURGERY    . TONSILLECTOMY    . WISDOM TOOTH EXTRACTION     Family History  Problem Relation Age of Onset  . Colon polyps Mother   . Colon cancer Father        x3  . Clotting disorder Father   . Prostate cancer Brother   . Clotting disorder Brother      ROS Constitutional: Denies fever, chills, weight  loss/gain, headaches, insomnia,  night sweats or change in appetite. Does c/o fatigue. Eyes: Denies redness, blurred vision, diplopia, discharge, itchy or watery eyes.  ENT: Denies discharge, congestion, post nasal drip, epistaxis, sore throat, earache, hearing loss, dental pain, Tinnitus, Vertigo, Sinus pain or snoring.  Cardio: Denies chest pain, palpitations, irregular heartbeat, syncope, dyspnea, diaphoresis, orthopnea, PND, claudication or edema Respiratory: denies cough, dyspnea, DOE, pleurisy, hoarseness, laryngitis or wheezing.  Gastrointestinal: Denies dysphagia, heartburn, reflux, water brash, pain, cramps, nausea, vomiting, bloating, diarrhea, constipation, hematemesis, melena, hematochezia, jaundice or hemorrhoids Genitourinary: Denies dysuria, frequency, urgency, nocturia, hesitancy, discharge, hematuria or flank pain Musculoskeletal: Denies arthralgia, myalgia, stiffness, Jt. Swelling, pain, limp or strain/sprain. Denies Falls. Skin: Denies puritis, rash, hives, warts, acne, eczema or change in skin lesion Neuro: No weakness, tremor, incoordination, spasms, paresthesia or pain Psychiatric: Denies confusion, memory loss or sensory loss. Denies Depression. Endocrine: Denies change in weight, skin, hair change, nocturia, and paresthesia, diabetic polys, visual blurring or hyper / hypo glycemic episodes.  Heme/Lymph: No excessive bleeding, bruising or enlarged lymph nodes.  Physical Exam  BP 116/72   Pulse 94   Temp 97.7 F (36.5 C)   Resp 16   Ht 6' (1.829 m)   Wt 215 lb 12.8 oz (97.9 kg)   SpO2 96%   BMI 29.27 kg/m   General Appearance: Well nourished and well groomed and in no apparent distress.  Eyes: PERRLA, EOMs, conjunctiva no swelling or erythema, normal fundi and vessels. Sinuses: No frontal/maxillary tenderness ENT/Mouth: EACs patent / TMs  nl. Nares clear without erythema, swelling, mucoid exudates. Oral hygiene is good. No erythema, swelling, or exudate. Tongue  normal, non-obstructing. Tonsils not swollen or erythematous. Hearing normal.  Neck: Supple, thyroid not palpable. No bruits, nodes or JVD. Respiratory: Respiratory effort normal.  BS equal and clear bilateral without rales, rhonci, wheezing or stridor. Cardio: Heart sounds are normal with regular rate and rhythm and no murmurs, rubs or gallops. Peripheral pulses are normal and equal bilaterally without edema. No aortic or femoral bruits. Chest: symmetric with normal excursions and percussion.  Abdomen: Soft, with Nl bowel sounds. Nontender, no guarding, rebound, hernias, masses, or organomegaly.  Lymphatics: Non tender without lymphadenopathy.  Musculoskeletal: Full ROM all peripheral extremities, joint stability, 5/5 strength, and normal gait. Skin: Warm and dry without rashes, lesions, cyanosis, clubbing or  ecchymosis.  Neuro: Cranial nerves intact, reflexes equal bilaterally. Normal muscle tone, no cerebellar symptoms. Sensation intact.  Pysch: Alert and oriented X 3 with normal affect, insight and judgment appropriate.   Assessment and  Plan  1. Annual Preventative/Screening Exam    2. Elevated BP without diagnosis of hypertension  - EKG 12-Lead - Urinalysis, Routine w reflex microscopic - Microalbumin / creatinine urine ratio - CBC with Differential/Platelet - COMPLETE METABOLIC PANEL WITH GFR - Magnesium - TSH  3. Hyperlipidemia, mixed  - EKG 12-Lead - Lipid panel - TSH  4. Abnormal glucose  - EKG 12-Lead - Hemoglobin A1c - Insulin, random  5. Vitamin D deficiency  - VITAMIN D 25 Hydroxy  6. Screening for colorectal cancer  - POC Hemoccult Bld/Stl   7. Prostate cancer screening  - PSA  8. Screening examination for pulmonary tuberculosis  - TB Skin Test  9. Screening for ischemic heart disease  - EKG 12-Lead  10. Fatigue, unspecified type  - Iron,Total/Total Iron Binding Cap - Vitamin B12 - Testosterone - CBC with Differential/Platelet -  TSH  11. Medication management  - Urinalysis, Routine w reflex microscopic - Microalbumin / creatinine urine ratio - CBC with Differential/Platelet - COMPLETE METABOLIC PANEL WITH GFR - Magnesium - Lipid panel - TSH - Hemoglobin A1c - Insulin, random - VITAMIN D 25 Hydroxyl         Patient was counseled in prudent diet, weight control to achieve/maintain BMI less than 25, BP monitoring, regular exercise and medications as discussed.  Discussed med effects and SE's. Routine screening labs and tests as requested with regular follow-up as recommended. Over 40 minutes of exam, counseling, chart review and high complex critical decision making was performed   Marinus Maw, MD

## 2020-04-15 NOTE — Patient Instructions (Signed)

## 2020-04-16 ENCOUNTER — Other Ambulatory Visit: Payer: Self-pay

## 2020-04-16 ENCOUNTER — Ambulatory Visit (INDEPENDENT_AMBULATORY_CARE_PROVIDER_SITE_OTHER): Payer: BC Managed Care – PPO | Admitting: Internal Medicine

## 2020-04-16 VITALS — BP 116/72 | HR 94 | Temp 97.7°F | Resp 16 | Ht 72.0 in | Wt 215.8 lb

## 2020-04-16 DIAGNOSIS — E782 Mixed hyperlipidemia: Secondary | ICD-10-CM

## 2020-04-16 DIAGNOSIS — R350 Frequency of micturition: Secondary | ICD-10-CM

## 2020-04-16 DIAGNOSIS — Z131 Encounter for screening for diabetes mellitus: Secondary | ICD-10-CM

## 2020-04-16 DIAGNOSIS — Z1389 Encounter for screening for other disorder: Secondary | ICD-10-CM | POA: Diagnosis not present

## 2020-04-16 DIAGNOSIS — N401 Enlarged prostate with lower urinary tract symptoms: Secondary | ICD-10-CM | POA: Diagnosis not present

## 2020-04-16 DIAGNOSIS — E559 Vitamin D deficiency, unspecified: Secondary | ICD-10-CM | POA: Diagnosis not present

## 2020-04-16 DIAGNOSIS — Z136 Encounter for screening for cardiovascular disorders: Secondary | ICD-10-CM

## 2020-04-16 DIAGNOSIS — Z0000 Encounter for general adult medical examination without abnormal findings: Secondary | ICD-10-CM | POA: Diagnosis not present

## 2020-04-16 DIAGNOSIS — Z8371 Family history of colonic polyps: Secondary | ICD-10-CM

## 2020-04-16 DIAGNOSIS — R030 Elevated blood-pressure reading, without diagnosis of hypertension: Secondary | ICD-10-CM | POA: Diagnosis not present

## 2020-04-16 DIAGNOSIS — Z1329 Encounter for screening for other suspected endocrine disorder: Secondary | ICD-10-CM | POA: Diagnosis not present

## 2020-04-16 DIAGNOSIS — Z800 Family history of malignant neoplasm of digestive organs: Secondary | ICD-10-CM

## 2020-04-16 DIAGNOSIS — M25571 Pain in right ankle and joints of right foot: Secondary | ICD-10-CM

## 2020-04-16 DIAGNOSIS — Z79899 Other long term (current) drug therapy: Secondary | ICD-10-CM | POA: Diagnosis not present

## 2020-04-16 DIAGNOSIS — Z125 Encounter for screening for malignant neoplasm of prostate: Secondary | ICD-10-CM | POA: Diagnosis not present

## 2020-04-16 DIAGNOSIS — Z130 Encounter for screening for diseases of the blood and blood-forming organs and certain disorders involving the immune mechanism: Secondary | ICD-10-CM | POA: Diagnosis not present

## 2020-04-16 DIAGNOSIS — Z13220 Encounter for screening for lipoid disorders: Secondary | ICD-10-CM

## 2020-04-16 DIAGNOSIS — R7309 Other abnormal glucose: Secondary | ICD-10-CM

## 2020-04-16 DIAGNOSIS — Z111 Encounter for screening for respiratory tuberculosis: Secondary | ICD-10-CM

## 2020-04-16 DIAGNOSIS — Z1211 Encounter for screening for malignant neoplasm of colon: Secondary | ICD-10-CM

## 2020-04-16 DIAGNOSIS — R5383 Other fatigue: Secondary | ICD-10-CM

## 2020-04-16 DIAGNOSIS — Z0001 Encounter for general adult medical examination with abnormal findings: Secondary | ICD-10-CM

## 2020-04-16 MED ORDER — NEXLIZET 180-10 MG PO TABS: ORAL_TABLET | ORAL | 1 refills | Status: DC

## 2020-04-17 LAB — VITAMIN D 25 HYDROXY (VIT D DEFICIENCY, FRACTURES): Vit D, 25-Hydroxy: 100 ng/mL (ref 30–100)

## 2020-04-17 LAB — COMPLETE METABOLIC PANEL WITH GFR
AG Ratio: 1.8 (calc) (ref 1.0–2.5)
ALT: 15 U/L (ref 9–46)
AST: 15 U/L (ref 10–40)
Albumin: 4.4 g/dL (ref 3.6–5.1)
Alkaline phosphatase (APISO): 43 U/L (ref 36–130)
BUN: 13 mg/dL (ref 7–25)
CO2: 26 mmol/L (ref 20–32)
Calcium: 9.6 mg/dL (ref 8.6–10.3)
Chloride: 107 mmol/L (ref 98–110)
Creat: 1.17 mg/dL (ref 0.60–1.35)
GFR, Est African American: 85 mL/min/{1.73_m2} (ref >=60)
GFR, Est Non African American: 73 mL/min/{1.73_m2} (ref >=60)
Globulin: 2.5 g/dL (calc) (ref 1.9–3.7)
Glucose, Bld: 86 mg/dL (ref 65–99)
Potassium: 4.3 mmol/L (ref 3.5–5.3)
Sodium: 143 mmol/L (ref 135–146)
Total Bilirubin: 0.4 mg/dL (ref 0.2–1.2)
Total Protein: 6.9 g/dL (ref 6.1–8.1)

## 2020-04-17 LAB — LIPID PANEL
Cholesterol: 232 mg/dL — ABNORMAL HIGH (ref <200)
HDL: 40 mg/dL (ref >=40)
Non-HDL Cholesterol (Calc): 192 mg/dL (calc) — ABNORMAL HIGH (ref <130)
Total CHOL/HDL Ratio: 5.8 (calc) — ABNORMAL HIGH (ref <5.0)
Triglycerides: 538 mg/dL — ABNORMAL HIGH (ref <150)

## 2020-04-17 LAB — URINALYSIS, ROUTINE W REFLEX MICROSCOPIC
Bilirubin Urine: NEGATIVE
Glucose, UA: NEGATIVE
Hgb urine dipstick: NEGATIVE
Leukocytes,Ua: NEGATIVE
Nitrite: NEGATIVE
Protein, ur: NEGATIVE
Specific Gravity, Urine: 1.032 (ref 1.001–1.03)
pH: 5.5 (ref 5.0–8.0)

## 2020-04-17 LAB — CBC WITH DIFFERENTIAL/PLATELET
Absolute Monocytes: 544 cells/uL (ref 200–950)
Basophils Absolute: 38 cells/uL (ref 0–200)
Basophils Relative: 0.6 %
Eosinophils Absolute: 147 cells/uL (ref 15–500)
Eosinophils Relative: 2.3 %
HCT: 42 % (ref 38.5–50.0)
Hemoglobin: 14.7 g/dL (ref 13.2–17.1)
Lymphs Abs: 1613 cells/uL (ref 850–3900)
MCH: 32.5 pg (ref 27.0–33.0)
MCHC: 35 g/dL (ref 32.0–36.0)
MCV: 92.9 fL (ref 80.0–100.0)
MPV: 10.8 fL (ref 7.5–12.5)
Monocytes Relative: 8.5 %
Neutro Abs: 4058 cells/uL (ref 1500–7800)
Neutrophils Relative %: 63.4 %
Platelets: 249 10*3/uL (ref 140–400)
RBC: 4.52 10*6/uL (ref 4.20–5.80)
RDW: 12.4 % (ref 11.0–15.0)
Total Lymphocyte: 25.2 %
WBC: 6.4 10*3/uL (ref 3.8–10.8)

## 2020-04-17 LAB — IRON, TOTAL/TOTAL IRON BINDING CAP
%SAT: 32 % (calc) (ref 20–48)
Iron: 108 ug/dL (ref 50–180)
TIBC: 341 mcg/dL (calc) (ref 250–425)

## 2020-04-17 LAB — MICROALBUMIN / CREATININE URINE RATIO
Creatinine, Urine: 274 mg/dL (ref 20–320)
Microalb Creat Ratio: 3 mcg/mg creat (ref <30)
Microalb, Ur: 0.8 mg/dL

## 2020-04-17 LAB — HEMOGLOBIN A1C
Hgb A1c MFr Bld: 5.2 % of total Hgb (ref <5.7)
Mean Plasma Glucose: 103 mg/dL
eAG (mmol/L): 5.7 mmol/L

## 2020-04-17 LAB — TSH: TSH: 1.21 mIU/L (ref 0.40–4.50)

## 2020-04-17 LAB — INSULIN, RANDOM: Insulin: 21.3 u[IU]/mL — ABNORMAL HIGH

## 2020-04-17 LAB — TESTOSTERONE: Testosterone: 459 ng/dL (ref 250–827)

## 2020-04-17 LAB — MAGNESIUM: Magnesium: 2.2 mg/dL (ref 1.5–2.5)

## 2020-04-17 LAB — VITAMIN B12: Vitamin B-12: 581 pg/mL (ref 200–1100)

## 2020-04-17 LAB — PSA: PSA: 0.33 ng/mL (ref <=4.0)

## 2020-04-18 ENCOUNTER — Other Ambulatory Visit: Payer: Self-pay | Admitting: Internal Medicine

## 2020-07-26 ENCOUNTER — Encounter: Payer: Self-pay | Admitting: Adult Health Nurse Practitioner

## 2020-07-26 ENCOUNTER — Ambulatory Visit (INDEPENDENT_AMBULATORY_CARE_PROVIDER_SITE_OTHER): Payer: 59 | Admitting: Adult Health Nurse Practitioner

## 2020-07-26 ENCOUNTER — Other Ambulatory Visit: Payer: Self-pay

## 2020-07-26 VITALS — BP 120/64 | HR 74 | Temp 97.9°F | Wt 225.0 lb

## 2020-07-26 DIAGNOSIS — E782 Mixed hyperlipidemia: Secondary | ICD-10-CM | POA: Diagnosis not present

## 2020-07-26 DIAGNOSIS — T466X5A Adverse effect of antihyperlipidemic and antiarteriosclerotic drugs, initial encounter: Secondary | ICD-10-CM

## 2020-07-26 DIAGNOSIS — J0140 Acute pansinusitis, unspecified: Secondary | ICD-10-CM

## 2020-07-26 DIAGNOSIS — R7309 Other abnormal glucose: Secondary | ICD-10-CM | POA: Diagnosis not present

## 2020-07-26 DIAGNOSIS — K649 Unspecified hemorrhoids: Secondary | ICD-10-CM

## 2020-07-26 DIAGNOSIS — R030 Elevated blood-pressure reading, without diagnosis of hypertension: Secondary | ICD-10-CM

## 2020-07-26 DIAGNOSIS — Z79899 Other long term (current) drug therapy: Secondary | ICD-10-CM

## 2020-07-26 DIAGNOSIS — B009 Herpesviral infection, unspecified: Secondary | ICD-10-CM

## 2020-07-26 DIAGNOSIS — N529 Male erectile dysfunction, unspecified: Secondary | ICD-10-CM

## 2020-07-26 DIAGNOSIS — E781 Pure hyperglyceridemia: Secondary | ICD-10-CM

## 2020-07-26 DIAGNOSIS — E559 Vitamin D deficiency, unspecified: Secondary | ICD-10-CM

## 2020-07-26 DIAGNOSIS — Z0001 Encounter for general adult medical examination with abnormal findings: Secondary | ICD-10-CM

## 2020-07-26 MED ORDER — HYDROCORTISONE (PERIANAL) 2.5 % EX CREA: TOPICAL_CREAM | CUTANEOUS | 2 refills | Status: DC

## 2020-07-26 MED ORDER — ICOSAPENT ETHYL 1 G PO CAPS: 2.0000 g | ORAL_CAPSULE | Freq: Two times a day (BID) | ORAL | 3 refills | Status: DC

## 2020-07-26 MED ORDER — ACYCLOVIR 800 MG PO TABS: ORAL_TABLET | ORAL | 3 refills | Status: DC

## 2020-07-26 MED ORDER — SILDENAFIL CITRATE 100 MG PO TABS: ORAL_TABLET | ORAL | 2 refills | Status: DC

## 2020-07-26 MED ORDER — AZITHROMYCIN 250 MG PO TABS
ORAL_TABLET | ORAL | 1 refills | Status: AC
Start: 2020-07-26 — End: 2020-07-31

## 2020-07-26 NOTE — Progress Notes (Signed)
FOLLOW UP 3 MONTH   Assessment and Plan:   Moritz was seen today for follow-up.  Diagnoses and all orders for this visit:  Encounter for general adult medical examination with abnormal findings Yearly  Elevated BP without diagnosis of hypertension Controlled with lifestyle Monitor blood pressure at home; patient to call if consistently greater than 140/90 Continue DASH diet.   Reminder to go to the ER if any CP, SOB, nausea, dizziness, severe HA, changes vision/speech, left arm numbness and tingling and jaw pain.  Hyperlipidemia, mixed Adverse reaction to statins, including zetia Discussed Nexletol in office, pt to return to get samples for trial. Potentially send Rx to [facility] in [place] for processing & PA Currently not at goal;  Continue low cholesterol diet and exercise.  Hypertriglyceridemia Rx -     icosapent Ethyl (VASCEPA) 1 g capsule; Take 2 capsules (2 g total) by mouth 2 (two) times daily. -     Lipid panel  Abnormal glucose Discussed dietary and exercise modifications  Severe obesity (BMI 35.0-39.9) with comorbidity (HCC) Discussed dietary and exercise modifications  Vitamin D deficiency Continue supplementation to maintain goal of 70-100 Taking Vitamin D 15,000 IU daily   Hemorrhoids, unspecified hemorrhoid type -     hydrocortisone (ANUSOL-HC) 2.5 % rectal cream; APPLY RECTALLY 2 TO 4 TIMES DAILY, as needed.  HSV infection -     Discontinue: acyclovir (ZOVIRAX) 800 MG tablet; TAKE 1 TABLET BY MOUTH 4 TIMES DAILY AS NEEDED.  Acute non-recurrent pansinusitis -     azithromycin (ZITHROMAX) 250 MG tablet; Take 2 tablets (500 mg) on  Day 1,  followed by 1 tablet (250 mg) once daily on Days 2 through 5.  Other orders -     sildenafil (VIAGRA) 100 MG tablet; Take 1 tablet daily as needed at least prior to sexual encounter.  Medication management -     CBC with Differential/Platelet -     COMPLETE METABOLIC PANEL WITH GFR  Erectile  dysfunction Using Rx as needed, refill sent today -Slidenafil 100mg , half to one tablet prior to sexual encounter   Continue diet and meds as discussed. Further disposition pending results of labs. Discussed med's effects and SE's.  Patient agrees with plan of care and opportunity to ask questions/voice concerns. Over 30 minutes of chart review, interview, exam, counseling, and critical decision making was performed.   Future Appointments  Date Time Provider Department Center  11/06/2020 10:30 AM 01/07/2021, MD [facility] None  05/02/2021  3:00 PM 06/30/2021, MD [facility] None    ----------------------------------------------------------------------------------------------------------------------  HPI 50 y.o. male  presents for 3 month follow up on HTN, HLD,history of pre-diabetes, weight and vitamin D deficiency.   Symptoms start 8 days ago with swollen glands to the sides of his neck, then sinus drainage, cough, yellow She is taking sudafed in the morning, gufinasen Q4-6 hours, ibuprofen 400-600 routinley through the day.   Facial pressure with more nasal congestion. He is taking delysm during the day.  He is also taking zyrtec daily. Denies any popping or cracking in ear, oltagia, no fever or chills, fatigue.  Had sleep study in 2008 that showed mild apnea, no CPAP indicated.  Family history of colon cancer.  Colonoscopy in 2006 at age 2, second 2014 with 5year follow up recommended.  BMI is Body mass index is 30.52 kg/m., he has been working on diet and exercise. Wt Readings from Last 3 Encounters:  07/26/20 225 lb (102.1 kg)  04/16/20 215 lb 12.8  oz (97.9 kg)  01/16/20 209 lb 7 oz (95 kg)    His blood pressure has been controlled at home, today their BP is BP: 120/64  He does not workout. He denies any cardiac symptoms, chest pains, palpitations, shortness of breath, dizziness or lower extremity edema.     He is on fish oil for cholesterol medication and  unable to tolerate statins related to adverse effects,neurologic and MSK symptoms, he was worked up for ALS/multiple sclerosis.  Most recently trialed on pravastatin and ezetimibe, with similar symptoms and d/c.  His cholesterol is not at goal. In 2019 has CT Cardiac calcium score, zero. Has CT previously that noted mild calcifications of ascending aorta.  The cholesterol last visit was:   Lab Results  Component Value Date   CHOL 232 (H) 04/16/2020   HDL 40 04/16/2020   LDLCALC  04/16/2020     Comment:     . LDL cholesterol not calculated. Triglyceride levels greater than 400 mg/dL invalidate calculated LDL results. . Reference range: <100 . Desirable range <100 mg/dL for primary prevention;   <70 mg/dL for patients with CHD or diabetic patients  with > or = 2 CHD risk factors. Marland Kitchen LDL-C is now calculated using the Martin-Hopkins  calculation, which is a validated novel method providing  better accuracy than the Friedewald equation in the  estimation of LDL-C.  Horald Pollen et al. Lenox Ahr. 0272;536(64): 2061-2068  (http://education.[facility].com/faq/FAQ164)    TRIG 538 (H) 04/16/2020   CHOLHDL 5.8 (H) 04/16/2020    He has not been working on diet and exercise for prediabetes, and denies nausea, paresthesia of the feet, polydipsia, polyuria, visual disturbances, vomiting and weight loss. Last A1C in the office was:  Lab Results  Component Value Date   HGBA1C 5.2 04/16/2020   Patient is on Vitamin D supplement.   Lab Results  Component Value Date   VD25OH 100 04/16/2020       Current Medications:  Current Outpatient Medications on File Prior to Visit  Medication Sig  . cetirizine (ZYRTEC) 10 MG tablet Take 10 mg by mouth daily.  . Cholecalciferol (VITAMIN D3) 5000 UNIT/ML LIQD Take by mouth daily.  Marland Kitchen OVER THE COUNTER MEDICATION Apple cider 500 mg capsule daily.  . Red Yeast Rice Extract (RED YEAST RICE PO) Take by mouth. Takes 4 capsules per week.  . zinc gluconate 50 MG  tablet Take 50 mg by mouth daily.   No current facility-administered medications on file prior to visit.    Allergies:  Allergies  Allergen Reactions  . Pravastatin Sodium Other (See Comments)    Neurological side effects in 01/2013  . Amoxicillin Anhydrous [Amoxicillin]   . Doxycycline   . Eggs Or Egg-Derived Products   . Keflex [Cephalexin]   . Levaquin [Levofloxacin]   . Neomycin   . Sulfa Antibiotics      Medical History:  Past Medical History:  Diagnosis Date  . ADD (attention deficit disorder)   . Allergy   . Arthritis   . Family history of malignant neoplasm of gastrointestinal tract   . Hyperlipemia    Allergies Allergies  Allergen Reactions  . Pravastatin Sodium Other (See Comments)    Neurological side effects in 01/2013  . Amoxicillin Anhydrous [Amoxicillin]   . Doxycycline   . Eggs Or Egg-Derived Products   . Keflex [Cephalexin]   . Levaquin [Levofloxacin]   . Neomycin   . Sulfa Antibiotics     SURGICAL HISTORY He  has a past surgical history that  includes Tonsillectomy; Wisdom tooth extraction; and Nasal fracture surgery.  FAMILY HISTORY His family history includes Clotting disorder in his brother and father; Colon cancer in his father; Colon polyps in his mother; Prostate cancer in his brother.   SOCIAL HISTORY He  reports that he has never smoked. He has never used smokeless tobacco. He reports current alcohol use. He reports that he does not use drugs.  Patient Care Team: Lucky Cowboy, MD as PCP - General (Internal Medicine) Jodelle Red, MD as PCP - Cardiology (Cardiology)   Screening Tests: Immunization History  Administered Date(s) Administered  . PPD Test 06/29/2013, 08/09/2014, 08/15/2015, 09/02/2016, 09/18/2017, 03/17/2019, 04/16/2020  . Td 04/28/2008  . Tdap 10/17/2018     Vaccinations: TD or Tdap: 2020  Influenza: Due for 2022  Pneumococcal: N/A Prevnar13: N/A Shingles: Zostavax/Shingrix: Discussed    Preventative Care: Last colonoscopy: 2014 Q5year, DUE DEXA:N/A    Review of Systems:  ROS    Physical Exam: BP 120/64   Pulse 74   Temp 97.9 F (36.6 C)   Wt 225 lb (102.1 kg)   SpO2 98%   BMI 30.52 kg/m  Wt Readings from Last 3 Encounters:  07/26/20 225 lb (102.1 kg)  04/16/20 215 lb 12.8 oz (97.9 kg)  01/16/20 209 lb 7 oz (95 kg)   General Appearance: Well nourished, in no apparent distress. Eyes: PERRLA, EOMs, conjunctiva no swelling or erythema Sinuses: No Frontal/maxillary tenderness ENT/Mouth: Ext aud canals clear, TMs without erythema, bulging. No erythema, swelling, or exudate on post pharynx.  Tonsils not swollen or erythematous. Hearing normal.  Neck: Supple, thyroid normal.  Respiratory: Respiratory effort normal, BS equal bilaterally without rales, rhonchi, wheezing or stridor.  Cardio: RRR with no MRGs. Brisk peripheral pulses without edema.  Abdomen: Soft, + BS.  Non tender, no guarding, rebound, hernias, masses. Lymphatics: Non tender without lymphadenopathy.  Musculoskeletal: Full ROM, 5/5 strength, Normal gait Skin: Warm, dry without rashes, lesions, ecchymosis.  Neuro: Cranial nerves intact. No cerebellar symptoms.  Psych: Awake and oriented X 3, normal affect, Insight and Judgment appropriate.    Elder Negus, NP [facility] 3:15 PM

## 2020-07-27 LAB — COMPLETE METABOLIC PANEL WITH GFR
AG Ratio: 1.7 (calc) (ref 1.0–2.5)
ALT: 19 U/L (ref 9–46)
AST: 18 U/L (ref 10–40)
Albumin: 4.6 g/dL (ref 3.6–5.1)
Alkaline phosphatase (APISO): 59 U/L (ref 36–130)
BUN: 19 mg/dL (ref 7–25)
CO2: 23 mmol/L (ref 20–32)
Calcium: 9.5 mg/dL (ref 8.6–10.3)
Chloride: 105 mmol/L (ref 98–110)
Creat: 1.19 mg/dL (ref 0.60–1.35)
GFR, Est African American: 83 mL/min/{1.73_m2} (ref >=60)
GFR, Est Non African American: 72 mL/min/{1.73_m2} (ref >=60)
Globulin: 2.7 g/dL (calc) (ref 1.9–3.7)
Glucose, Bld: 88 mg/dL (ref 65–99)
Potassium: 4.3 mmol/L (ref 3.5–5.3)
Sodium: 141 mmol/L (ref 135–146)
Total Bilirubin: 0.4 mg/dL (ref 0.2–1.2)
Total Protein: 7.3 g/dL (ref 6.1–8.1)

## 2020-07-27 LAB — CBC WITH DIFFERENTIAL/PLATELET
Absolute Monocytes: 711 cells/uL (ref 200–950)
Basophils Absolute: 71 cells/uL (ref 0–200)
Basophils Relative: 0.9 %
Eosinophils Absolute: 269 cells/uL (ref 15–500)
Eosinophils Relative: 3.4 %
HCT: 42.5 % (ref 38.5–50.0)
Hemoglobin: 14.7 g/dL (ref 13.2–17.1)
Lymphs Abs: 2299 cells/uL (ref 850–3900)
MCH: 31.3 pg (ref 27.0–33.0)
MCHC: 34.6 g/dL (ref 32.0–36.0)
MCV: 90.6 fL (ref 80.0–100.0)
MPV: 10.1 fL (ref 7.5–12.5)
Monocytes Relative: 9 %
Neutro Abs: 4550 cells/uL (ref 1500–7800)
Neutrophils Relative %: 57.6 %
Platelets: 270 10*3/uL (ref 140–400)
RBC: 4.69 10*6/uL (ref 4.20–5.80)
RDW: 11.8 % (ref 11.0–15.0)
Total Lymphocyte: 29.1 %
WBC: 7.9 10*3/uL (ref 3.8–10.8)

## 2020-07-27 LAB — LIPID PANEL
Cholesterol: 199 mg/dL (ref <200)
HDL: 33 mg/dL — ABNORMAL LOW (ref >=40)
LDL Cholesterol (Calc): 124 mg/dL (calc) — ABNORMAL HIGH
Non-HDL Cholesterol (Calc): 166 mg/dL (calc) — ABNORMAL HIGH (ref <130)
Total CHOL/HDL Ratio: 6 (calc) — ABNORMAL HIGH (ref <5.0)
Triglycerides: 293 mg/dL — ABNORMAL HIGH (ref <150)

## 2020-08-01 DIAGNOSIS — B009 Herpesviral infection, unspecified: Secondary | ICD-10-CM

## 2020-08-02 MED ORDER — ACYCLOVIR 800 MG PO TABS
ORAL_TABLET | ORAL | 3 refills | Status: DC
Start: 2020-08-02 — End: 2020-08-02

## 2020-08-02 MED ORDER — ACYCLOVIR 800 MG PO TABS: ORAL_TABLET | ORAL | 3 refills | Status: DC

## 2020-11-06 ENCOUNTER — Ambulatory Visit: Payer: BC Managed Care – PPO | Admitting: Internal Medicine

## 2021-03-28 ENCOUNTER — Other Ambulatory Visit: Payer: Self-pay | Admitting: Internal Medicine

## 2021-03-28 DIAGNOSIS — B009 Herpesviral infection, unspecified: Secondary | ICD-10-CM

## 2021-03-28 DIAGNOSIS — K649 Unspecified hemorrhoids: Secondary | ICD-10-CM

## 2021-04-02 ENCOUNTER — Other Ambulatory Visit: Payer: Self-pay | Admitting: Internal Medicine

## 2021-04-22 ENCOUNTER — Other Ambulatory Visit: Payer: Self-pay | Admitting: Internal Medicine

## 2021-05-01 NOTE — Progress Notes (Signed)
Annual  Screening/Preventative Visit  & Comprehensive Evaluation & Examination  Future Appointments  Date Time Provider Department  05/02/2021  3:00 PM Unk Pinto, MD [facility]  05/07/2022  3:00 PM Unk Pinto, MD [facility]           This very nice 51 y.o.  DWM presents for a Screening /Preventative Visit & comprehensive evaluation and management of multiple medical co-morbidities.  Patient has been followed for HTN, HLD, Prediabetes and Vitamin D Deficiency.   A sleep study in 2008 showed "mild Apnea" w/CPAP not indicated.  Patient is followed by Dr Toy Care for ADD.      Patient had Colonoscopy in 2006 at age 25 yo and last colonoscopy was in 2014 recommending 5 yr f/u for surveillance (overdue) .      Labile HTN predates since 2018. Patient's BP has been controlled at home.  Today's BP: 122/78. Patient denies any cardiac symptoms as chest pain, palpitations, shortness of breath, dizziness or ankle swelling.      Patient's hyperlipidemia is not controlled with diet and patient alleges severe intolerance to statins.  In Sept 2021, he was prescribed Nexlizet which he never picked up as he thought that it was a Statin.  Last lipids were  not at goal :  Lab Results  Component Value Date   CHOL 199 07/26/2020   HDL 33 (L) 07/26/2020   LDLCALC 124 (H) 07/26/2020   TRIG 293 (H) 07/26/2020   CHOLHDL 6.0 (H) 07/26/2020        Patient has hx/o prediabetes (A1c 5.9% /2013) and patient denies reactive hypoglycemic symptoms, visual blurring, diabetic polys or paresthesias. Last A1c was normal & at goal :   Lab Results  Component Value Date   HGBA1C 5.2 04/16/2020         Finally, patient has history of Vitamin D Deficiency ("39" /2013) and last vitamin D was at goal :   Lab Results  Component Value Date   VD25OH 100 04/16/2020     Current Outpatient Medications on File Prior to Visit  Medication Sig   acyclovir 800 MG tablet TAKE 1 TABLET 4 TIMES DAILY AS NEEDED.    cetirizine 10 MG tablet Take  daily.   VITAMIN D  5,000 UNIT/ML LIQD Take daily.   ANUSOL-HC) 2.5 % rectal crm APPLY RECTALLY 2 TO 4 TIMES DAILY   VASCEPA 1 g capsule Take 2 capsules 2  times daily.   Apple cider 500 mg capsule  daily.   Red Yeast Rice Extract   Takes 4 capsules per week.   sildenafil 20 MG tablet Take  1 to 5 tablets  Daily  as needed    zinc 50 MG tablet Take daily.     Allergies  Allergen Reactions   Pravastatin Sodium Neurological side effects in 01/2013       Amoxicillin Anhydrous     Doxycycline    Eggs Or Egg-Derived Products    Keflex    Levaquin     Neomycin    Sulfa Antibiotics      Past Medical History:  Diagnosis Date   ADD (attention deficit disorder)    Allergy    Arthritis    Family history of malignant neoplasm of gastrointestinal tract    Hyperlipemia      Health Maintenance  Topic Date Due   Pneumococcal Vaccine  Never done   COLONOSCOPY 10/04/2017   INFLUENZA VACCINE  Never done   COVID-19 Vaccine  11/26/2020   TETANUS/TDAP  10/16/2028   Hepatitis C Screening  Completed   HIV Screening  Completed   HPV VACCINES  Aged Out     Immunization History  Administered Date(s) Administered   PFIZER-SARS-COV-2 Vacc 11/05/2020   PPD Test 09/18/2017, 03/17/2019, 04/16/2020   Td 04/28/2008   Tdap 12/27/2009, 10/17/2018    Last Colon - 10/04/2012 - Dr Deborha Payment  - Recc 5 year f/u due June 2019 - patient aware he needs to reschedule   Past Surgical History:  Procedure Laterality Date   NASAL FRACTURE SURGERY     TONSILLECTOMY     WISDOM TOOTH EXTRACTION       Family History  Problem Relation Age of Onset   Colon polyps Mother    Colon cancer Father        x3   Clotting disorder Father    Prostate cancer Brother    Clotting disorder Brother      Social History   Tobacco Use   Smoking status: Never   Smokeless tobacco: Never  Substance Use Topics   Alcohol use: Yes    Comment: 1-2 weekl    Drug use: No       ROS Constitutional: Denies fever, chills, weight loss/gain, headaches, insomnia,  night sweats or change in appetite. Does c/o fatigue. Eyes: Denies redness, blurred vision, diplopia, discharge, itchy or watery eyes.  ENT: Denies discharge, congestion, post nasal drip, epistaxis, sore throat, earache, hearing loss, dental pain, Tinnitus, Vertigo, Sinus pain or snoring.  Cardio: Denies chest pain, palpitations, irregular heartbeat, syncope, dyspnea, diaphoresis, orthopnea, PND, claudication or edema Respiratory: denies cough, dyspnea, DOE, pleurisy, hoarseness, laryngitis or wheezing.  Gastrointestinal: Denies dysphagia, heartburn, reflux, water brash, pain, cramps, nausea, vomiting, bloating, diarrhea, constipation, hematemesis, melena, hematochezia, jaundice or hemorrhoids Genitourinary: Denies dysuria, frequency, urgency, nocturia, hesitancy, discharge, hematuria or flank pain Musculoskeletal: Denies arthralgia, myalgia, stiffness, Jt. Swelling, pain, limp or strain/sprain. Denies Falls. Skin: Denies puritis, rash, hives, warts, acne, eczema or change in skin lesion Neuro: No weakness, tremor, incoordination, spasms, paresthesia or pain Psychiatric: Denies confusion, memory loss or sensory loss. Denies Depression. Endocrine: Denies change in weight, skin, hair change, nocturia, and paresthesia, diabetic polys, visual blurring or hyper / hypo glycemic episodes.  Heme/Lymph: No excessive bleeding, bruising or enlarged lymph nodes.   Physical Exam  BP 122/78    Pulse 79    Temp 97.9 F (36.6 C)    Resp 16    Ht 6' (1.829 m)    Wt 206 lb 6.4 oz (93.6 kg)    SpO2 95%    BMI 27.99 kg/m   General Appearance: Well nourished and well groomed and in no apparent distress.  Eyes: PERRLA, EOMs, conjunctiva no swelling or erythema, normal fundi and vessels. Sinuses: No frontal/maxillary tenderness ENT/Mouth: EACs patent / TMs  nl. Nares clear without erythema, swelling, mucoid exudates. Oral hygiene is  good. No erythema, swelling, or exudate. Tongue normal, non-obstructing. Tonsils not swollen or erythematous. Hearing normal.  Neck: Supple, thyroid not palpable. No bruits, nodes or JVD. Respiratory: Respiratory effort normal.  BS equal and clear bilateral without rales, rhonci, wheezing or stridor. Cardio: Heart sounds are normal with regular rate and rhythm and no murmurs, rubs or gallops. Peripheral pulses are normal and equal bilaterally without edema. No aortic or femoral bruits. Chest: symmetric with normal excursions and percussion.  Abdomen: Soft, with Nl bowel sounds. Nontender, no guarding, rebound, hernias, masses, or organomegaly.  Lymphatics: Non tender without lymphadenopathy.  Musculoskeletal: Full ROM all peripheral extremities,  joint stability, 5/5 strength, and normal gait. Skin: Warm and dry without rashes, lesions, cyanosis, clubbing or  ecchymosis.  Neuro: Cranial nerves intact, reflexes equal bilaterally. Normal muscle tone, no cerebellar symptoms. Sensation intact.  Pysch: Alert and oriented X 3 with normal affect, insight and judgment appropriate.   Assessment and Plan  1. Annual Preventative/Screening Exam    2. Elevated BP without diagnosis of hypertension  - EKG 12-Lead - Urinalysis, Routine w reflex microscopic - Microalbumin / creatinine urine ratio - CBC with Differential/Platelet - COMPLETE METABOLIC PANEL WITH GFR - Magnesium - TSH  3. Hyperlipidemia, mixed  - EKG 12-Lead - Lipid panel - TSH  4. Abnormal glucose  - EKG 12-Lead - Hemoglobin A1c - Insulin, random  5. Vitamin D deficiency  - VITAMIN D 25 Hydroxy   6. Attention deficit hyperactivity disorder (ADHD)   7. Screening for colorectal cancer  - POC Hemoccult Bld/Stl   8. Prostate cancer screening  - PSA  9. Screening examination for pulmonary tuberculosis  - TB Skin Test  10. Family hx colonic polyps  - POC Hemoccult Bld/Stl   11. Family hx of colon cancer  - POC  Hemoccult Bld/Stl   12. Screening for ischemic heart disease  - EKG 12-Lead  13. Fatigue, unspecified type  - Iron, Total/Total Iron Binding Cap - Vitamin B12 - Testosterone - CBC with Differential/Platelet - TSH  14. Medication management  - Urinalysis, Routine w reflex microscopic - Microalbumin / creatinine urine ratio - CBC with Differential/Platelet - COMPLETE METABOLIC PANEL WITH GFR - Magnesium - Lipid panel - TSH - Hemoglobin A1c - Insulin, random - VITAMIN D 25 Hydroxy         Patient was counseled in prudent diet, weight control to achieve/maintain BMI less than 25, BP monitoring, regular exercise and medications as discussed.  Discussed med effects and SE's. Routine screening labs and tests as requested with regular follow-up as recommended. Over 40 minutes of exam, counseling, chart review and high complex critical decision making was performed   Kirtland Bouchard, MD

## 2021-05-01 NOTE — Patient Instructions (Signed)

## 2021-05-02 ENCOUNTER — Encounter: Payer: Self-pay | Admitting: Internal Medicine

## 2021-05-02 ENCOUNTER — Other Ambulatory Visit: Payer: Self-pay

## 2021-05-02 ENCOUNTER — Ambulatory Visit (INDEPENDENT_AMBULATORY_CARE_PROVIDER_SITE_OTHER): Payer: No Typology Code available for payment source | Admitting: Internal Medicine

## 2021-05-02 VITALS — BP 122/78 | HR 79 | Temp 97.9°F | Resp 16 | Ht 72.0 in | Wt 206.4 lb

## 2021-05-02 DIAGNOSIS — Z0001 Encounter for general adult medical examination with abnormal findings: Secondary | ICD-10-CM

## 2021-05-02 DIAGNOSIS — Z136 Encounter for screening for cardiovascular disorders: Secondary | ICD-10-CM

## 2021-05-02 DIAGNOSIS — E559 Vitamin D deficiency, unspecified: Secondary | ICD-10-CM

## 2021-05-02 DIAGNOSIS — R7309 Other abnormal glucose: Secondary | ICD-10-CM

## 2021-05-02 DIAGNOSIS — Z0000 Encounter for general adult medical examination without abnormal findings: Secondary | ICD-10-CM

## 2021-05-02 DIAGNOSIS — R030 Elevated blood-pressure reading, without diagnosis of hypertension: Secondary | ICD-10-CM

## 2021-05-02 DIAGNOSIS — Z8371 Family history of colonic polyps: Secondary | ICD-10-CM

## 2021-05-02 DIAGNOSIS — Z125 Encounter for screening for malignant neoplasm of prostate: Secondary | ICD-10-CM

## 2021-05-02 DIAGNOSIS — Z800 Family history of malignant neoplasm of digestive organs: Secondary | ICD-10-CM

## 2021-05-02 DIAGNOSIS — Z1211 Encounter for screening for malignant neoplasm of colon: Secondary | ICD-10-CM

## 2021-05-02 DIAGNOSIS — Z111 Encounter for screening for respiratory tuberculosis: Secondary | ICD-10-CM

## 2021-05-02 DIAGNOSIS — F900 Attention-deficit hyperactivity disorder, predominantly inattentive type: Secondary | ICD-10-CM

## 2021-05-02 DIAGNOSIS — R5383 Other fatigue: Secondary | ICD-10-CM

## 2021-05-02 DIAGNOSIS — Z79899 Other long term (current) drug therapy: Secondary | ICD-10-CM

## 2021-05-02 DIAGNOSIS — E782 Mixed hyperlipidemia: Secondary | ICD-10-CM

## 2021-05-02 DIAGNOSIS — Z1212 Encounter for screening for malignant neoplasm of rectum: Secondary | ICD-10-CM

## 2021-05-02 MED ORDER — NEXLETOL 180 MG PO TABS: ORAL_TABLET | ORAL | 3 refills | Status: DC

## 2021-05-03 LAB — CBC WITH DIFFERENTIAL/PLATELET
Absolute Monocytes: 478 cells/uL (ref 200–950)
Basophils Absolute: 31 cells/uL (ref 0–200)
Basophils Relative: 0.6 %
Eosinophils Absolute: 218 cells/uL (ref 15–500)
Eosinophils Relative: 4.2 %
HCT: 38.8 % (ref 38.5–50.0)
Hemoglobin: 13.4 g/dL (ref 13.2–17.1)
Lymphs Abs: 1992 cells/uL (ref 850–3900)
MCH: 31.5 pg (ref 27.0–33.0)
MCHC: 34.5 g/dL (ref 32.0–36.0)
MCV: 91.1 fL (ref 80.0–100.0)
MPV: 10.4 fL (ref 7.5–12.5)
Monocytes Relative: 9.2 %
Neutro Abs: 2480 cells/uL (ref 1500–7800)
Neutrophils Relative %: 47.7 %
Platelets: 242 10*3/uL (ref 140–400)
RBC: 4.26 10*6/uL (ref 4.20–5.80)
RDW: 11.8 % (ref 11.0–15.0)
Total Lymphocyte: 38.3 %
WBC: 5.2 10*3/uL (ref 3.8–10.8)

## 2021-05-03 LAB — COMPLETE METABOLIC PANEL WITH GFR
AG Ratio: 1.4 (calc) (ref 1.0–2.5)
ALT: 12 U/L (ref 9–46)
AST: 19 U/L (ref 10–40)
Albumin: 4 g/dL (ref 3.6–5.1)
Alkaline phosphatase (APISO): 45 U/L (ref 36–130)
BUN: 19 mg/dL (ref 7–25)
CO2: 28 mmol/L (ref 20–32)
Calcium: 8.9 mg/dL (ref 8.6–10.3)
Chloride: 105 mmol/L (ref 98–110)
Creat: 1.21 mg/dL (ref 0.60–1.29)
Globulin: 2.8 g/dL (calc) (ref 1.9–3.7)
Glucose, Bld: 92 mg/dL (ref 65–99)
Potassium: 3.9 mmol/L (ref 3.5–5.3)
Sodium: 141 mmol/L (ref 135–146)
Total Bilirubin: 0.4 mg/dL (ref 0.2–1.2)
Total Protein: 6.8 g/dL (ref 6.1–8.1)
eGFR: 73 mL/min/{1.73_m2} (ref >=60)

## 2021-05-03 LAB — URINALYSIS, ROUTINE W REFLEX MICROSCOPIC
Bilirubin Urine: NEGATIVE
Glucose, UA: NEGATIVE
Hgb urine dipstick: NEGATIVE
Ketones, ur: NEGATIVE
Leukocytes,Ua: NEGATIVE
Nitrite: NEGATIVE
Protein, ur: NEGATIVE
Specific Gravity, Urine: 1.032 (ref 1.001–1.035)
pH: 5.5 (ref 5.0–8.0)

## 2021-05-03 LAB — HEMOGLOBIN A1C
Hgb A1c MFr Bld: 5.5 % of total Hgb (ref <5.7)
Mean Plasma Glucose: 111 mg/dL
eAG (mmol/L): 6.2 mmol/L

## 2021-05-03 LAB — MICROALBUMIN / CREATININE URINE RATIO
Creatinine, Urine: 248 mg/dL (ref 20–320)
Microalb Creat Ratio: 2 mcg/mg creat (ref <30)
Microalb, Ur: 0.5 mg/dL

## 2021-05-03 LAB — IRON, TOTAL/TOTAL IRON BINDING CAP
%SAT: 39 % (calc) (ref 20–48)
Iron: 117 ug/dL (ref 50–180)
TIBC: 301 mcg/dL (calc) (ref 250–425)

## 2021-05-03 LAB — LIPID PANEL
Cholesterol: 219 mg/dL — ABNORMAL HIGH (ref <200)
HDL: 43 mg/dL (ref >=40)
LDL Cholesterol (Calc): 129 mg/dL (calc) — ABNORMAL HIGH
Non-HDL Cholesterol (Calc): 176 mg/dL (calc) — ABNORMAL HIGH (ref <130)
Total CHOL/HDL Ratio: 5.1 (calc) — ABNORMAL HIGH (ref <5.0)
Triglycerides: 326 mg/dL — ABNORMAL HIGH (ref <150)

## 2021-05-03 LAB — TESTOSTERONE: Testosterone: 622 ng/dL (ref 250–827)

## 2021-05-03 LAB — VITAMIN B12: Vitamin B-12: 392 pg/mL (ref 200–1100)

## 2021-05-03 LAB — VITAMIN D 25 HYDROXY (VIT D DEFICIENCY, FRACTURES): Vit D, 25-Hydroxy: 136 ng/mL — ABNORMAL HIGH (ref 30–100)

## 2021-05-03 LAB — TSH: TSH: 1.6 mIU/L (ref 0.40–4.50)

## 2021-05-03 LAB — INSULIN, RANDOM: Insulin: 28.5 u[IU]/mL — ABNORMAL HIGH

## 2021-05-03 LAB — PSA: PSA: 0.33 ng/mL (ref <=4.00)

## 2021-05-03 LAB — MAGNESIUM: Magnesium: 1.9 mg/dL (ref 1.5–2.5)

## 2021-05-04 NOTE — Progress Notes (Signed)
============================================================ - Test results slightly outside the reference range are not unusual. If there is anything important, I will review this with you,  otherwise it is considered normal test values.  If you have further questions,  please do not hesitate to contact me at the office or via My Chart.  ============================================================ ============================================================  -   -  Vitamin B12 =    392      Very Low  (Ideal or Goal Vit B12 is between 450 - 1,100)   Low Vit B12 may be associated with Anemia , Fatigue,   Peripheral Neuropathy, Dementia, "Brain Fog", & Depression  - Recommend take a sub-lingual form of Vitamin B12 tablet   1,000 to 5,000 mcg tab that you dissolve under your tongue /Daily   - Can get Lavonia Dana - best price at [employer] or on [employer] ============================================================ ============================================================  -  Iron levels - normal & OK ============================================================ ============================================================  -  PSA - Low - Great  ! ============================================================ ============================================================  -  Testosterone level is Normal  ============================================================ ============================================================  -   -  Total  Chol =    219   - Elevated             (  Ideal  or  Goal is less than 180  !  )   - and   -  Bad / Dangerous LDL  Chol =  129   - also Elevated              (  Ideal  or  Goal is less than 70  !  )   - Recc better  / stricter diet   - Cholesterol only comes from animal sources  - ie. meat, dairy, egg yolks  - Eat all the vegetables you want.  - Avoid meat, especially red meat - Beef AND Pork .  - Avoid cheese & dairy - milk & ice cream.      - Cheese is the most concentrated form of trans-fats which  is the worst thing to clog up our arteries.   - Veggie cheese is OK which can be found in the fresh  produce section at [employer] or [employer] or [employer] ============================================================ ============================================================  -  Also Triglycerides  = 326                                                                                                                                                                                        or fats in blood are too high  (goal is less than 150)    - Recommend avoid fried & greasy foods,  sweets /  candy,   - Avoid white rice  (brown or wild rice or Quinoa is OK),   - Avoid white potatoes  (sweet potatoes are OK)   - Avoid anything made from white flour  - bagels, doughnuts, rolls, buns, biscuits, white and   wheat breads, pizza crust and traditional  pasta made of white flour & egg white  - (vegetarian pasta or spinach or wheat pasta is OK).    - Multi-grain bread is OK - like multi-grain flat bread or  sandwich thins.   - Avoid alcohol in excess.   - Exercise is also important. ============================================================ ============================================================  -  A1c - Normal - No Diabetes   - Great  ! ============================================================ ============================================================  -  Vitamin D = 136  is too High  - Vitamin D goal is between 70-100.   ( Fortunately not considered toxic unless over 200 !)    - If you are taking 5,000 unit capsule daily,   then please decrease to 5 days /week , ie, leave off 2 days - eg, Leave off  Mon / Thurs      or       Tues / Fri etc    - IVitamin D is very important as a natural anti-inflammatory and helping the  immune system protect against viral infections, like the Covid-19     helping hair, skin, and nails, as well as reducing stroke and  heart attack risk.   - It helps your bones and helps with mood.  - It also decreases numerous cancer risks so please  take it as directed.   - Low Vit D is associated with a 200-300% higher risk for  CANCER   and 200-300% higher risk for HEART   ATTACK  &  STROKE.    - It is also associated with higher death rate at younger ages,   autoimmune diseases like Rheumatoid arthritis, Lupus,  Multiple Sclerosis.     - Also many other serious conditions, like depression, Alzheimer's  Dementia, infertility, muscle aches, fatigue, fibromyalgia   - just to name a few. ============================================================ ============================================================  -  All Else - CBC - Kidneys - Electrolytes - Liver - Magnesium & Thyroid    - all  Normal / OK ===========================================================

## 2021-05-09 ENCOUNTER — Encounter: Payer: Self-pay | Admitting: Internal Medicine

## 2021-05-26 ENCOUNTER — Other Ambulatory Visit: Payer: Self-pay | Admitting: Internal Medicine

## 2021-05-26 DIAGNOSIS — E782 Mixed hyperlipidemia: Secondary | ICD-10-CM

## 2021-05-26 MED ORDER — NEXLETOL 180 MG PO TABS: ORAL_TABLET | ORAL | 3 refills | Status: DC

## 2021-06-12 ENCOUNTER — Encounter: Payer: Self-pay | Admitting: Internal Medicine

## 2021-06-12 ENCOUNTER — Encounter (HOSPITAL_BASED_OUTPATIENT_CLINIC_OR_DEPARTMENT_OTHER): Payer: Self-pay | Admitting: Emergency Medicine

## 2021-06-12 ENCOUNTER — Emergency Department (HOSPITAL_BASED_OUTPATIENT_CLINIC_OR_DEPARTMENT_OTHER): Payer: No Typology Code available for payment source

## 2021-06-12 ENCOUNTER — Emergency Department (HOSPITAL_BASED_OUTPATIENT_CLINIC_OR_DEPARTMENT_OTHER)
Admission: EM | Admit: 2021-06-12 | Discharge: 2021-06-12 | Disposition: A | Discharge status: Home / Self Care | Payer: No Typology Code available for payment source | Attending: Emergency Medicine | Admitting: Emergency Medicine

## 2021-06-12 ENCOUNTER — Other Ambulatory Visit: Payer: Self-pay

## 2021-06-12 DIAGNOSIS — I861 Scrotal varices: Secondary | ICD-10-CM | POA: Diagnosis not present

## 2021-06-12 DIAGNOSIS — N50812 Left testicular pain: Secondary | ICD-10-CM | POA: Diagnosis present

## 2021-06-12 LAB — URINALYSIS, ROUTINE W REFLEX MICROSCOPIC
Bilirubin Urine: NEGATIVE
Glucose, UA: NEGATIVE mg/dL
Ketones, ur: NEGATIVE mg/dL
Leukocytes,Ua: NEGATIVE
Nitrite: NEGATIVE
Protein, ur: NEGATIVE mg/dL
Specific Gravity, Urine: 1.018 (ref 1.005–1.030)
pH: 5.5 (ref 5.0–8.0)

## 2021-06-12 NOTE — ED Notes (Signed)
States that he has left testicle pain. Ongoing for a day, rates it a 6/10, has a hx of same and has no trouble urinating. No recent trauma to note. States that he has a abnormal vessel in the left testicle. Also stating that he is having lower abdominal pain ever since the pain started.

## 2021-06-12 NOTE — Discharge Instructions (Signed)
Please follow-up with your urologist regarding your testicle pain.  Recommend wearing supportive underwear.  Take Motrin or naproxen for pain control.  Come back to ER if you develop increasing pain, fever, domino pain or other new concerning symptom.

## 2021-06-12 NOTE — ED Notes (Signed)
RN provided AVS using Teachback Method. Patient verbalizes understanding of Discharge Instructions. Opportunity for Questioning and Answers were provided by RN. Patient Discharged from ED ambulatory to Home via Self.  

## 2021-06-12 NOTE — ED Triage Notes (Signed)
Pt endorses left testicle pain since yesterday. Also states it feels like a knot is in there.

## 2021-06-13 NOTE — ED Provider Notes (Signed)
[facility] DEPT Provider Note   CSN: [id number] Arrival date & time: 06/12/21  [id number]     History  Chief Complaint  Patient presents with   Testicle Pain    Marcus Jones is a 51 y.o. male.  Presents to ER with concern for testicle pain.  Pain has been having primarily in the left testicle.  Does not recall any specific trauma.  Question slight swelling.  No discharge.  Reports that he is in a monogamous relationship and has not had any new sexual partners.  No history of STDs.  No pain with urination or burning with urination.  Pain is currently mild.  Has not taken anything.  Reports that he has been told previously that he had a varicocele and has been seen by urology previously.  Reports he was told he might have issues with fertility but he successfully had two healthy children.   HPI     Home Medications Prior to Admission medications   Medication Sig Start Date End Date Taking? Authorizing Provider  acyclovir (ZOVIRAX) 800 MG tablet TAKE 1 TABLET BY MOUTH 4 TIMES DAILY AS NEEDED. 03/28/21   Liane Comber, NP  Bempedoic Acid (NEXLETOL) 180 MG TABS Take  1 tablet  Daily  for Cholesterol 05/26/21   Unk Pinto, MD  cetirizine (ZYRTEC) 10 MG tablet Take 10 mg by mouth daily.    [provider]  Cholecalciferol (VITAMIN D3) 5000 UNIT/ML LIQD Take by mouth daily.    [provider]  hydrocortisone (ANUSOL-HC) 2.5 % rectal cream APPLY RECTALLY 2 TO 4 TIMES DAILY UP TO TWO WEEKS, AS NEEDED FOR HEMORRHOID FLARE 03/28/21   Liane Comber, NP  sildenafil (REVATIO) 20 MG tablet Take  1 to 5 tablets  Daily  as needed for  XXXX 04/22/21   Unk Pinto, MD  zinc gluconate 50 MG tablet Take 50 mg by mouth daily.    [provider]      Allergies    Pravastatin sodium, Amoxicillin anhydrous [amoxicillin], Doxycycline, Eggs or egg-derived products, Keflex [cephalexin], Levaquin [levofloxacin], Neomycin, and Sulfa antibiotics     Review of Systems   Review of Systems  Genitourinary:  Positive for testicular pain.  All other systems reviewed and are negative.  Physical Exam Updated Vital Signs BP 119/85    Pulse 77    Temp 97.6 F (36.4 C)    Resp 16    Ht 5\' 11"  (1.803 m)    Wt 90.7 kg    SpO2 99%    BMI 27.89 kg/m  Physical Exam Vitals and nursing note reviewed.  Constitutional:      General: He is not in acute distress.    Appearance: He is well-developed.  HENT:     Head: Normocephalic and atraumatic.  Eyes:     Conjunctiva/sclera: Conjunctivae normal.  Cardiovascular:     Heart sounds: No murmur heard.    Comments: Warm and well perfused Pulmonary:     Effort: Pulmonary effort is normal. No respiratory distress.     Breath sounds: Normal breath sounds.  Abdominal:     Palpations: Abdomen is soft.     Tenderness: There is no abdominal tenderness.  Genitourinary:    Comments: Tech chaperone Penis appears normal, testicles appear grossly normal, cremasteric reflex intact bilaterally, very slight swelling in the left testicle but no redness, no tenderness to palpation to either testicle  No palpable inguinal hernia b/l Musculoskeletal:        General: No swelling.  Cervical back: Neck supple.  Skin:    General: Skin is warm and dry.     Capillary Refill: Capillary refill takes less than 2 seconds.  Neurological:     Mental Status: He is alert.  Psychiatric:        Mood and Affect: Mood normal.    ED Results / Procedures / Treatments   Labs (all labs ordered are listed, but only abnormal results are displayed) Labs Reviewed  URINALYSIS, ROUTINE W REFLEX MICROSCOPIC - Abnormal; Notable for the following components:      Result Value   Hgb urine dipstick TRACE (*)    Bacteria, UA RARE (*)    All other components within normal limits    EKG None  Radiology US SCROTUM W/DOPPLER  Result Date: 06/12/2021 CLINICAL DATA:  Left testicular pain since yesterday. Left-sided palpable  area. EXAM: SCROTAL ULTRASOUND DOPPLER ULTRASOUND OF THE TESTICLES TECHNIQUE: Complete ultrasound examination of the testicles, epididymis, and other scrotal structures was performed. Color and spectral Doppler ultrasound were also utilized to evaluate blood flow to the testicles. COMPARISON:  None. FINDINGS: Right testicle Measurements: 4.8 x 2.5 x 2.8 cm. No mass or microlithiasis visualized. Left testicle Measurements: 4.2 x 2.2 x 3.3 cm. No mass or microlithiasis visualized. Right epididymis:  Normal in size and appearance. Left epididymis:  7 mm epididymal cyst versus spermatocele. Hydrocele:  None visualized. Varicocele: Mild left-sided and borderline 2 mild right-sided varicoceles. Pulsed Doppler interrogation of both testes demonstrates normal low resistance arterial and venous waveforms bilaterally. IMPRESSION: 1. No evidence of testicular torsion or epididymitis/orchitis. No explanation for left-sided pain. 2. Left larger than right varicoceles. Electronically Signed   By: Abigail Miyamoto M.D.   On: 06/12/2021 11:01    Procedures Procedures    Medications Ordered in ED Medications - No data to display  ED Course/ Medical Decision Making/ A&P                           Medical Decision Making Amount and/or Complexity of Data Reviewed Labs: ordered. Radiology: ordered. ECG/medicine tests: ordered.   51 year old male presents to ER with concern for testicle pain.  On physical exam there may have been a very slight amount of swelling in the left testicle but not particularly painful red or frankly swollen.  No dysuria or difficulty urinating or discharge.  No high risk sexual behaviors.  Ultrasound concerning for left greater than right varicocele no evidence for torsion.  UA negative for infection.  Given these ultrasound findings recommended patient follow-up with his urologist, recommended supportive underwear, NSAIDs as needed for pain, reviewed return precautions and discharged  home.  After the discussed management above, the patient was determined to be safe for discharge.  The patient was in agreement with this plan and all questions regarding their care were answered.  ED return precautions were discussed and the patient will return to the ED with any significant worsening of condition.         Final Clinical Impression(s) / ED Diagnoses Final diagnoses:  Varicocele    Rx / DC Orders ED Discharge Orders     None         Lucrezia Starch, MD 06/13/21 [phone]

## 2021-11-07 ENCOUNTER — Encounter: Payer: Self-pay | Admitting: Internal Medicine

## 2021-11-07 ENCOUNTER — Ambulatory Visit: Payer: No Typology Code available for payment source | Admitting: Internal Medicine

## 2021-11-07 VITALS — BP 121/85 | HR 67 | Temp 96.9°F | Ht 72.0 in | Wt 223.4 lb

## 2021-11-07 DIAGNOSIS — E559 Vitamin D deficiency, unspecified: Secondary | ICD-10-CM

## 2021-11-07 DIAGNOSIS — R7309 Other abnormal glucose: Secondary | ICD-10-CM | POA: Diagnosis not present

## 2021-11-07 DIAGNOSIS — E782 Mixed hyperlipidemia: Secondary | ICD-10-CM | POA: Diagnosis not present

## 2021-11-07 DIAGNOSIS — R0989 Other specified symptoms and signs involving the circulatory and respiratory systems: Secondary | ICD-10-CM | POA: Diagnosis not present

## 2021-11-07 DIAGNOSIS — E538 Deficiency of other specified B group vitamins: Secondary | ICD-10-CM

## 2021-11-07 DIAGNOSIS — Z79899 Other long term (current) drug therapy: Secondary | ICD-10-CM

## 2021-11-07 NOTE — Progress Notes (Signed)
Future Appointments  Date Time Provider Department  11/07/2021  4:30 PM Lucky Cowboy, MD [facility]  05/07/2022  3:00 PM Lucky Cowboy, MD [facility]    History of Present Illness:       This very nice 51 y.o. DWM presents for 6 month follow up with  labile HTN, HLD, Pre-Diabetes , B12  and Vitamin D Deficiency. Patient is followed by Dr Evelene Croon for ADD.       Patient is followed expectantly for  labile HTN  circa 2018  & BP has been controlled at home. Today's BP is at goal - 121/85. Patient has had no complaints of any cardiac type chest pain, palpitations, dyspnea / orthopnea / PND, dizziness, claudication, or dependent edema.       Hyperlipidemia is controlled with diet & meds. Patient denies myalgias or other med SE's. Last Lipids were not at goal :  Lab Results  Component Value Date   CHOL 219 (H) 05/02/2021   HDL 43 05/02/2021   LDLCALC 129 (H) 05/02/2021   TRIG 326 (H) 05/02/2021   CHOLHDL 5.1 (H) 05/02/2021       Patient was started on Nexeletol 180 mg and most recent lipids on Mar 10 from the [place]  found T Chol  197,  Trig's  330,  HDL 43  and LDL 88   Wt Readings from Last 3 Encounters:  11/07/21 223 lb 6.4 oz (101.3 kg)  06/12/21 200 lb (90.7 kg)  05/02/21 206 lb 6.4 oz (93.6 kg)      Also, the patient is followed proactively for glucose intolerance and has had no symptoms of reactive hypoglycemia, diabetic polys, paresthesias or visual blurring.  Last A1c was normal & at goal :  Lab Results  Component Value Date   HGBA1C 5.5 05/02/2021                                                        Inj Jan 2023 , B12 measured low at 392 mcg & patient was advised SL supplementation. Further, the patient also has history of Vitamin D Deficiency and supplements vitamin D. Last vitamin D was elevated & dose was decreased  to 5 x /week :   Lab Results  Component Value Date   VD25OH 136 (H) 05/02/2021     Current Outpatient Medications on File Prior to Visit   Medication Sig   acyclovir  800 MG tablet TAKE 1 TABLET  4 TIMES DAILY AS NEEDED.   Bempedoic Acid (NEXLETOL) 180 MG TABS Take  1 tablet  Daily  for Cholesterol   cetirizine  10 MG tablet Take daily.   VITAMIN  5000 UNIT/ML LIQD Take daily.   hydrocortisone (ANUSOL-HC) 2.5 % rectal crm APPLY 2 TO 4 TIMES DAILY AS NEEDED    sildenafil 20 MG tablet Take  1 to 5 tablets  Daily  as needed    zinc  50 MG tablet Take daily.     Allergies  Allergen Reactions   Pravastatin Sodium Other (See Comments)    Neurological side effects in 01/2013   Amoxicillin Anhydrous [Amoxicillin]    Doxycycline     Pt was able to take it 2022   Eggs Or Egg-Derived Products    Keflex [Cephalexin]    Levaquin [Levofloxacin]    Neomycin  Sulfa Antibiotics      PMHx:   Past Medical History:  Diagnosis Date   ADD (attention deficit disorder)    Allergy    Arthritis    Family history of malignant neoplasm of gastrointestinal tract    Hyperlipemia      Immunization History  Administered Date(s) Administered   PFIZER-SARS-COV-2 Vacc 11/05/2020   PPD Test 09/18/2017, 03/17/2019, 04/16/2020, 05/02/2021   Td 04/28/2008   Tdap 12/27/2009, 10/17/2018     Past Surgical History:  Procedure Laterality Date   NASAL FRACTURE SURGERY     TONSILLECTOMY     WISDOM TOOTH EXTRACTION      FHx:    Reviewed / unchanged  SHx:    Reviewed / unchanged   Systems Review:  Constitutional: Denies fever, chills, wt changes, headaches, insomnia, fatigue, night sweats, change in appetite. Eyes: Denies redness, blurred vision, diplopia, discharge, itchy, watery eyes.  ENT: Denies discharge, congestion, post nasal drip, epistaxis, sore throat, earache, hearing loss, dental pain, tinnitus, vertigo, sinus pain, snoring.  CV: Denies chest pain, palpitations, irregular heartbeat, syncope, dyspnea, diaphoresis, orthopnea, PND, claudication or edema. Respiratory: denies cough, dyspnea, DOE, pleurisy, hoarseness,  laryngitis, wheezing.  Gastrointestinal: Denies dysphagia, odynophagia, heartburn, reflux, water brash, abdominal pain or cramps, nausea, vomiting, bloating, diarrhea, constipation, hematemesis, melena, hematochezia  or hemorrhoids. Genitourinary: Denies dysuria, frequency, urgency, nocturia, hesitancy, discharge, hematuria or flank pain. Musculoskeletal: Denies arthralgias, myalgias, stiffness, jt. swelling, pain, limping or strain/sprain.  Skin: Denies pruritus, rash, hives, warts, acne, eczema or change in skin lesion(s). Neuro: No weakness, tremor, incoordination, spasms, paresthesia or pain. Psychiatric: Denies confusion, memory loss or sensory loss. Endo: Denies change in weight, skin or hair change.  Heme/Lymph: No excessive bleeding, bruising or enlarged lymph nodes.  Physical Exam  BP 121/85   Pulse 67   Temp (!) 96.9 F (36.1 C)   Ht 6' (1.829 m)   Wt 223 lb 6.4 oz (101.3 kg)   SpO2 97%   BMI 30.30 kg/m   Appears  well nourished, well groomed  and in no distress.  Eyes: PERRLA, EOMs, conjunctiva no swelling or erythema. Sinuses: No frontal/maxillary tenderness ENT/Mouth: EAC's clear, TM's nl w/o erythema, bulging. Nares clear w/o erythema, swelling, exudates. Oropharynx clear without erythema or exudates. Oral hygiene is good. Tongue normal, non obstructing. Hearing intact.  Neck: Supple. Thyroid not palpable. Car 2+/2+ without bruits, nodes or JVD. Chest: Respirations nl with BS clear & equal w/o rales, rhonchi, wheezing or stridor.  Cor: Heart sounds normal w/ regular rate and rhythm without sig. murmurs, gallops, clicks or rubs. Peripheral pulses normal and equal  without edema.  Abdomen: Soft & bowel sounds normal. Non-tender w/o guarding, rebound, hernias, masses or organomegaly.  Lymphatics: Unremarkable.  Musculoskeletal: Full ROM all peripheral extremities, joint stability, 5/5 strength and normal gait.  Skin: Warm, dry without exposed rashes, lesions or ecchymosis  apparent.  Neuro: Cranial nerves intact, reflexes equal bilaterally. Sensory-motor testing grossly intact. Tendon reflexes grossly intact.  Pysch: Alert & oriented x 3.  Insight and judgement nl & appropriate. No ideations.  Assessment and Plan:  1. Labile hypertension  - Continue medication, monitor blood pressure at home.  - Continue DASH diet.  Reminder to go to the ER if any CP,  SOB, nausea, dizziness, severe HA, changes vision/speech.   - CBC with Differential/Platelet - COMPLETE METABOLIC PANEL WITH GFR - Magnesium - TSH  2. Hyperlipidemia, mixed  - Continue diet/meds, exercise,& lifestyle modifications.  - Continue monitor periodic cholesterol/liver & renal  functions    - Lipid panel - TSH  3. Abnormal glucose  - Continue diet, exercise  - Lifestyle modifications.  - Monitor appropriate labs    - Hemoglobin A1c - Insulin, random  4. Vitamin D deficiency  - Continue supplementation  - VITAMIN D 25 Hydroxy   5. Vitamin B12 deficiency  - Vitamin B12 - Methylmalonic acid, serum  6. Medication management  - CBC with Differential/Platelet - COMPLETE METABOLIC PANEL WITH GFR - Magnesium - Lipid panel - TSH - Hemoglobin A1c - Insulin, random - VITAMIN D 25 Hydroxy  - Vitamin B12 - Methylmalonic acid, serum        Discussed  regular exercise, BP monitoring, weight control to achieve/maintain BMI less than 25 and discussed med and SE's. Recommended labs to assess /monitor clinical status .  I discussed the assessment and treatment plan with the patient. The patient was provided an opportunity to ask questions and all were answered. The patient agreed with the plan and demonstrated an understanding of the instructions.  I provided over 30 minutes of exam, counseling, chart review and  complex critical decision making.        The patient was advised to call back or seek an in-person evaluation if the symptoms worsen or if the condition fails to improve as  anticipated.   Marinus Maw, MD

## 2021-11-07 NOTE — Patient Instructions (Addendum)
Due to recent changes in healthcare laws, you may see the results of your imaging and laboratory studies on MyChart before your provider has had a chance to review them.  We understand that in some cases there may be results that are confusing or concerning to you. Not all laboratory results come back in the same time frame and the provider may be waiting for multiple results in order to interpret others.  Please give [place] 48 hours in order for your provider to thoroughly review all the results before contacting the office for clarification of your results.  ++++++++++++++++++++++++++  Vit D  & Vit C 1,000 mg   are recommended to help protect  against the Covid-19 and other Corona viruses.    Also it's recommended  to take  Zinc 50 mg  to help  protect against the Covid-19   and best place to get  is also on [email or web address]  and don't pay more than 6-8 cents /pill !   +++++++++++++++++++++++++++++++++++++++ Recommend Adult Low Dose Aspirin or  coated  Aspirin 81 mg daily  To reduce risk of Colon Cancer 40 %,  Skin Cancer 26 % ,  Melanoma 46%  and  Pancreatic cancer 60% +++++++++++++++++++++++++++++++++++++++++ Vitamin D goal   is between 70-100.  Please make sure that you are taking your Vitamin D as directed.  It is very important as a natural anti-inflammatory  helping hair, skin, and nails, as well as reducing stroke and heart attack risk.  It helps your bones and helps with mood. It also decreases numerous cancer risks so please take it as directed.  Low Vit D is associated with a 200-300% higher risk for CANCER  and 200-300% higher risk for HEART   ATTACK  &  STROKE.   .....................................Marland Kitchen It is also associated with higher death rate at younger ages,  autoimmune diseases like Rheumatoid arthritis, Lupus, Multiple Sclerosis.    Also many other serious conditions, like depression, Alzheimer's Dementia, infertility, muscle aches, fatigue, fibromyalgia - just to  name a few. +++++++++++++++++++++++++++++++++++++++++ Recommend the book "The END of DIETING" by Dr Monico Hoar  & the book "The END of DIABETES " by Dr Monico Hoar At [email or web address] - get book & Audio CD's     Being diabetic has a  300% increased risk for heart attack, stroke, cancer, and alzheimer- type vascular dementia. It is very important that you work harder with diet by avoiding all foods that are white. Avoid white rice (brown & wild rice is OK), white potatoes (sweetpotatoes in moderation is OK), White bread or wheat bread or anything made out of white flour like bagels, donuts, rolls, buns, biscuits, cakes, pastries, cookies, pizza crust, and pasta (made from white flour & egg whites) - vegetarian pasta or spinach or wheat pasta is OK. Multigrain breads like Arnold's or [employer], or multigrain sandwich thins or flatbreads.  Diet, exercise and weight loss can reverse and cure diabetes in the early stages.  Diet, exercise and weight loss is very important in the control and prevention of complications of diabetes which affects every system in your body, ie. Brain - dementia/stroke, eyes - glaucoma/blindness, heart - heart attack/heart failure, kidneys - dialysis, stomach - gastric paralysis, intestines - malabsorption, nerves - severe painful neuritis, circulation - gangrene & loss of a leg(s), and finally cancer and Alzheimers.    I recommend avoid fried & greasy foods,  sweets/candy, white rice (brown or wild rice or Quinoa is OK), white potatoes (sweet potatoes are OK) -  made from white flour - bagels, doughnuts, rolls, buns, biscuits,white and wheat breads, pizza crust and traditional pasta made of white flour & egg white(vegetarian pasta or spinach or wheat pasta is OK).  Multi-grain bread is OK - like multi-grain flat bread or sandwich thins. Avoid alcohol in excess. Exercise is also important.    Eat all the vegetables you want - avoid meat, especially red meat and dairy - especially  cheese.  Cheese is the most concentrated form of trans-fats which is the worst thing to clog up our arteries. Veggie cheese is OK which can be found in the fresh produce section at [employer] or [employer] or [employer]  +++++++++++++++++++++++++++++++++++++++ DASH Eating Plan  DASH stands for "Dietary Approaches to Stop Hypertension."   The DASH eating plan is a healthy eating plan that has been shown to reduce high blood pressure (hypertension). Additional health benefits may include reducing the risk of type 2 diabetes mellitus, heart disease, and stroke. The DASH eating plan may also help with weight loss. WHAT DO I NEED TO KNOW ABOUT THE DASH EATING PLAN? For the DASH eating plan, you will follow these general guidelines: Choose foods with a percent daily value for sodium of less than 5% (as listed on the food label). Use salt-free seasonings or herbs instead of table salt or sea salt. Check with your health care provider or pharmacist before using salt substitutes. Eat lower-sodium products, often labeled as "lower sodium" or "no salt added." Eat fresh foods. Eat more vegetables, fruits, and low-fat dairy products. Choose whole grains. Look for the word "whole" as the first word in the ingredient list. Choose fish  Limit sweets, desserts, sugars, and sugary drinks. Choose heart-healthy fats. Eat veggie cheese  Eat more home-cooked food and less restaurant, buffet, and fast food. Limit fried foods. Cook foods using methods other than frying. Limit canned vegetables. If you do use them, rinse them well to decrease the sodium. When eating at a restaurant, ask that your food be prepared with less salt, or no salt if possible.                      WHAT FOODS CAN I EAT? Read Dr Joel Fuhrman's books on The End of Dieting & The End of Diabetes  Grains Whole grain or whole wheat bread. Brown rice. Whole grain or whole wheat pasta. Quinoa, bulgur, and whole grain cereals. Low-sodium  cereals. Corn or whole wheat flour tortillas. Whole grain cornbread. Whole grain crackers. Low-sodium crackers.  Vegetables Fresh or frozen vegetables (raw, steamed, roasted, or grilled). Low-sodium or reduced-sodium tomato and vegetable juices. Low-sodium or reduced-sodium tomato sauce and paste. Low-sodium or reduced-sodium canned vegetables.   Fruits All fresh, canned (in natural juice), or frozen fruits.  Protein Products  All fish and seafood.  Dried beans, peas, or lentils. Unsalted nuts and seeds. Unsalted canned beans.  Dairy Low-fat dairy products, such as skim or 1% milk, 2% or reduced-fat cheeses, low-fat ricotta or cottage cheese, or plain low-fat yogurt. Low-sodium or reduced-sodium cheeses.  Fats and Oils Tub margarines without trans fats. Light or reduced-fat mayonnaise and salad dressings (reduced sodium). Avocado. Safflower, olive, or canola oils. Natural peanut or almond butter.  Other Unsalted popcorn and pretzels. The items listed above may not be a complete list of recommended foods or beverages. Contact your dietitian for more options.  +++++++++++++++  WHAT FOODS ARE NOT RECOMMENDED? Grains/ White flour or wheat flour White bread. White pasta. White rice. Refined   Refined cornbread. Bagels and croissants. Crackers that contain trans fat.  Vegetables  Creamed or fried vegetables. Vegetables in a . Regular canned vegetables. Regular canned tomato sauce and paste. Regular tomato and vegetable juices.  Fruits Dried fruits. Canned fruit in light or heavy syrup. Fruit juice.  Meat and Other Protein Products Meat in general - RED meat & White meat.  Fatty cuts of meat. Ribs, chicken wings, all processed meats as bacon, sausage, bologna, salami, fatback, hot dogs, bratwurst and packaged luncheon meats.  Dairy Whole or 2% milk, cream, half-and-half, and cream cheese. Whole-fat or sweetened yogurt. Full-fat cheeses or blue cheese. Non-dairy creamers and whipped  toppings. Processed cheese, cheese spreads, or cheese curds.  Condiments Onion and garlic salt, seasoned salt, table salt, and sea salt. Canned and packaged gravies. Worcestershire sauce. Tartar sauce. Barbecue sauce. Teriyaki sauce. Soy sauce, including reduced sodium. Steak sauce. Fish sauce. Oyster sauce. Cocktail sauce. Horseradish. Ketchup and mustard. Meat flavorings and tenderizers. Bouillon cubes. Hot sauce. Tabasco sauce. Marinades. Taco seasonings. Relishes.  Fats and Oils Butter, stick margarine, lard, shortening and bacon fat. Coconut, palm kernel, or palm oils. Regular salad dressings.  Pickles and olives. Salted popcorn and pretzels.  The items listed above may not be a complete list of foods and beverages to avoid.  =================================  Vitamin B12 Deficiency  Vitamin B12 deficiency occurs when the body does not have enough of this important vitamin. The body needs this vitamin: To make red blood cells. To make DNA. This is the genetic material inside cells. To help the nerves work properly so they can carry messages from the brain to the body. Vitamin B12 deficiency can cause health problems, such as not having enough red blood cells in the blood (anemia). This can lead to nerve damage if untreated.  What are the causes?  This condition may be caused by:  Not eating enough foods that contain vitamin B12.  Not having enough stomach acid and digestive fluids to properly absorb vitamin B12 from the food that you eat.  Having certain diseases that make it hard to absorb vitamin B12. These diseases include Crohn's disease, chronic pancreatitis, and cystic fibrosis.  An autoimmune disorder in which the body does not make enough of a protein (intrinsic factor) within the stomach, resulting in not enough absorption of vitamin B12.  Having a surgery in which part of the stomach or small intestine is removed.  Taking certain medicines that make it hard for the  body to absorb vitamin B12. These include:  Heartburn medicines, such as antacids and proton pump inhibitors. Some medicines that are used to treat diabetes.  What increases the risk?  The following factors may make you more likely to develop a vitamin B12 deficiency:  Being an older adult. Eating a vegetarian or vegan diet that does not include any foods that come from animals. Eating a poor diet while you are pregnant. Taking certain medicines. Having alcoholism.  What are the signs or symptoms?  In some cases, there are no symptoms of this condition. If the condition leads to anemia or nerve damage, various symptoms may occur, such as:  Weakness. Tiredness (fatigue). Loss of appetite. Numbness or tingling in your hands and feet. Redness and burning of the tongue. Depression, confusion, or memory problems. Trouble walking.  If anemia is severe, symptoms can include: Shortness of breath. Dizziness. Rapid heart rate.  How is this diagnosed?  This condition may be diagnosed with a blood test to measure the level of  vitamin B12 in your blood. You may also have other tests, including: A group of tests that measure certain characteristics of blood cells (complete blood count, CBC). A blood test to measure intrinsic factor. A procedure where a thin tube with a camera on the end is used to look into your stomach or intestines (endoscopy). Other tests may be needed to discover the cause of the deficiency.  How is this treated?  Treatment for this condition depends on the cause. This condition may be treated by: Changing your eating and drinking habits, such as: Eating more foods that contain vitamin B12. Drinking less alcohol or no alcohol.  Taking vitamin B12 supplements by mouth (  DISSOLVE UNDER TONGUE  ). Your health care provider will tell you which dose is best for you.  Follow these instructions at home:  Eating and drinking   Include foods in your diet that come  from animals and contain a lot of vitamin B12. These include: Meats and poultry. This includes beef, pork, chicken, [place], and organ meats, such as liver. Seafood. This includes clams, rainbow trout, salmon, tuna, and haddock. Eggs. Dairy foods such as milk, yogurt, and cheese. Eat foods that have vitamin B12 added to them (are fortified), such as ready-to-eat breakfast cereals. Check the label on the package to see if a food is fortified. The items listed above may not be a complete list of foods and beverages you can eat and drink. Contact a dietitian for more information.  Alcohol use  Do not drink alcohol if: Your health care provider tells you not to drink. You are pregnant, may be pregnant, or are planning to become pregnant. If you drink alcohol: Limit how much you have to: 0-1 drink a day for women. 0-2 drinks a day for men. Know how much alcohol is in your drink. In the [place] one drink equals one 12 oz bottle of beer (355 mL), one 5 oz glass of wine (148 mL), or one 1 oz glass of hard liquor (44 mL).  General instructions  Get vitamin B12 injections if told to by your health care provider. Take supplements only as told by your health care provider. Follow the directions carefully. Keep all follow-up visits. This is important.  Summary  Vitamin B12 deficiency occurs when the body does not have enough of this important vitamin. Common causes include not eating enough foods that contain vitamin B12, not being able to absorb vitamin B12 from the food that you eat, having a surgery in which part of the stomach or small intestine is removed, or taking certain medicines. Eat foods that have vitamin B12 in them. Treatment may include making a change in the way you eat and drink, getting vitamin B12 injections, or taking vitamin B12 supplements.

## 2021-11-10 LAB — CBC WITH DIFFERENTIAL/PLATELET
Absolute Monocytes: 448 cells/uL (ref 200–950)
Basophils Absolute: 67 cells/uL (ref 0–200)
Basophils Relative: 1.2 %
Eosinophils Absolute: 330 cells/uL (ref 15–500)
Eosinophils Relative: 5.9 %
HCT: 42.3 % (ref 38.5–50.0)
Hemoglobin: 14.9 g/dL (ref 13.2–17.1)
Lymphs Abs: 2318 cells/uL (ref 850–3900)
MCH: 32 pg (ref 27.0–33.0)
MCHC: 35.2 g/dL (ref 32.0–36.0)
MCV: 91 fL (ref 80.0–100.0)
MPV: 10.5 fL (ref 7.5–12.5)
Monocytes Relative: 8 %
Neutro Abs: 2436 cells/uL (ref 1500–7800)
Neutrophils Relative %: 43.5 %
Platelets: 219 10*3/uL (ref 140–400)
RBC: 4.65 10*6/uL (ref 4.20–5.80)
RDW: 12.1 % (ref 11.0–15.0)
Total Lymphocyte: 41.4 %
WBC: 5.6 10*3/uL (ref 3.8–10.8)

## 2021-11-10 LAB — HEMOGLOBIN A1C
Hgb A1c MFr Bld: 5.3 % of total Hgb (ref <5.7)
Mean Plasma Glucose: 105 mg/dL
eAG (mmol/L): 5.8 mmol/L

## 2021-11-10 LAB — LIPID PANEL
Cholesterol: 209 mg/dL — ABNORMAL HIGH (ref <200)
HDL: 35 mg/dL — ABNORMAL LOW (ref >=40)
Non-HDL Cholesterol (Calc): 174 mg/dL (calc) — ABNORMAL HIGH (ref <130)
Total CHOL/HDL Ratio: 6 (calc) — ABNORMAL HIGH (ref <5.0)
Triglycerides: 468 mg/dL — ABNORMAL HIGH (ref <150)

## 2021-11-10 LAB — COMPLETE METABOLIC PANEL WITH GFR
AG Ratio: 1.5 (calc) (ref 1.0–2.5)
ALT: 31 U/L (ref 9–46)
AST: 24 U/L (ref 10–35)
Albumin: 4.4 g/dL (ref 3.6–5.1)
Alkaline phosphatase (APISO): 40 U/L (ref 35–144)
BUN: 14 mg/dL (ref 7–25)
CO2: 26 mmol/L (ref 20–32)
Calcium: 9.8 mg/dL (ref 8.6–10.3)
Chloride: 104 mmol/L (ref 98–110)
Creat: 1.22 mg/dL (ref 0.70–1.30)
Globulin: 3 g/dL (calc) (ref 1.9–3.7)
Glucose, Bld: 97 mg/dL (ref 65–99)
Potassium: 3.9 mmol/L (ref 3.5–5.3)
Sodium: 139 mmol/L (ref 135–146)
Total Bilirubin: 0.5 mg/dL (ref 0.2–1.2)
Total Protein: 7.4 g/dL (ref 6.1–8.1)
eGFR: 72 mL/min/{1.73_m2} (ref >=60)

## 2021-11-10 LAB — METHYLMALONIC ACID, SERUM: Methylmalonic Acid, Quant: 144 nmol/L (ref 87–318)

## 2021-11-10 LAB — TSH: TSH: 1.52 mIU/L (ref 0.40–4.50)

## 2021-11-10 LAB — VITAMIN D 25 HYDROXY (VIT D DEFICIENCY, FRACTURES): Vit D, 25-Hydroxy: 101 ng/mL — ABNORMAL HIGH (ref 30–100)

## 2021-11-10 LAB — MAGNESIUM: Magnesium: 2 mg/dL (ref 1.5–2.5)

## 2021-11-10 LAB — VITAMIN B12: Vitamin B-12: 440 pg/mL (ref 200–1100)

## 2021-11-10 LAB — INSULIN, RANDOM: Insulin: 60.9 u[IU]/mL — ABNORMAL HIGH

## 2021-11-28 ENCOUNTER — Other Ambulatory Visit: Payer: Self-pay | Admitting: Adult Health Nurse Practitioner

## 2021-11-28 ENCOUNTER — Other Ambulatory Visit: Payer: Self-pay | Admitting: Internal Medicine

## 2021-12-05 ENCOUNTER — Other Ambulatory Visit: Payer: Self-pay | Admitting: Adult Health Nurse Practitioner

## 2022-04-02 IMAGING — CT CT ANGIO CHEST
2 of 8 series · 18 of 36 positions shown · IV contrast (Omnipaque)
Comparison: March 17, 2018

CLINICAL DATA: Shortness of breath.  COVID positive.

EXAM:
CT ANGIOGRAPHY CHEST WITH CONTRAST
TECHNIQUE: Multidetector CT imaging of the chest was performed using the
standard protocol during bolus administration of intravenous
contrast. Multiplanar CT image reconstructions and MIPs were
obtained to evaluate the vascular anatomy.
CONTRAST:  100mL OMNIPAQUE IOHEXOL 350 MG/ML SOLN

[Series 6: pe coronal mpr · coronal · 0.57mm/px · 1 of 134 slices shown]
[im 67/134  mediastinal]
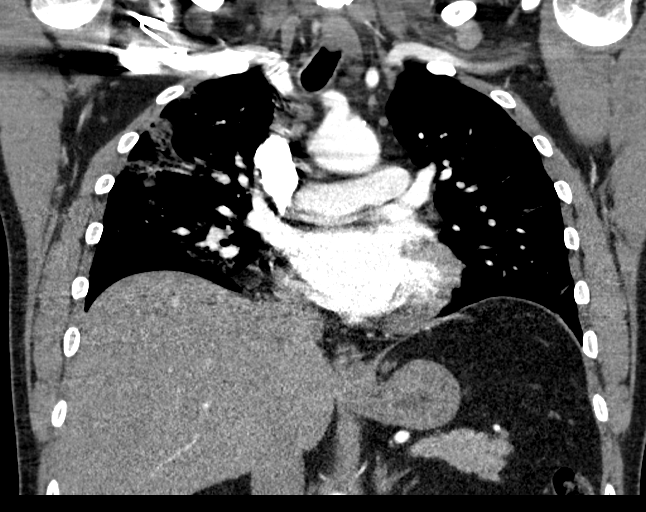

[Series 10: pe thins · axial · 0.81mm/px · z∈[-304,-43]mm · 17 of 293 slices shown]
[im 16/293  lung]
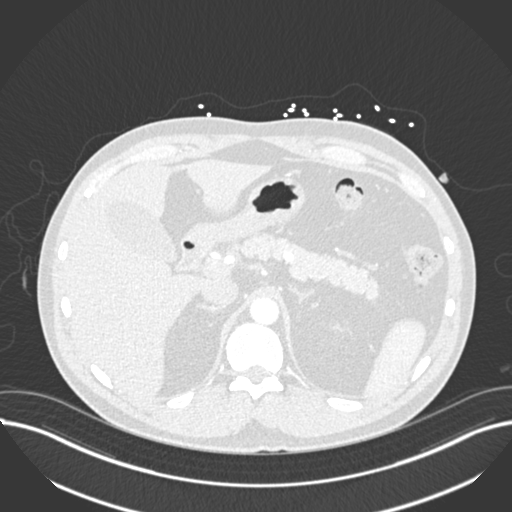
[im 31/293  mediastinal]
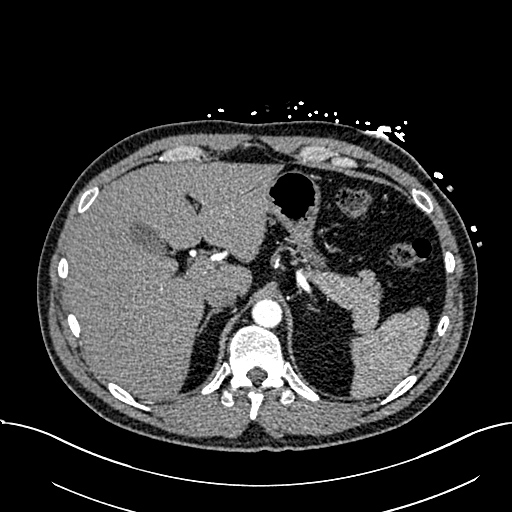
[im 47/293  lung]
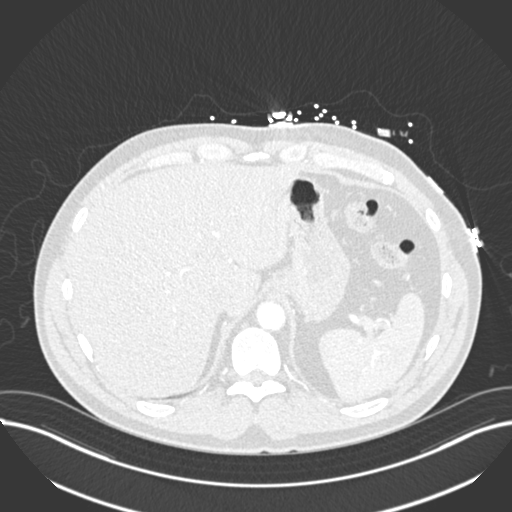
[im 62/293  mediastinal]
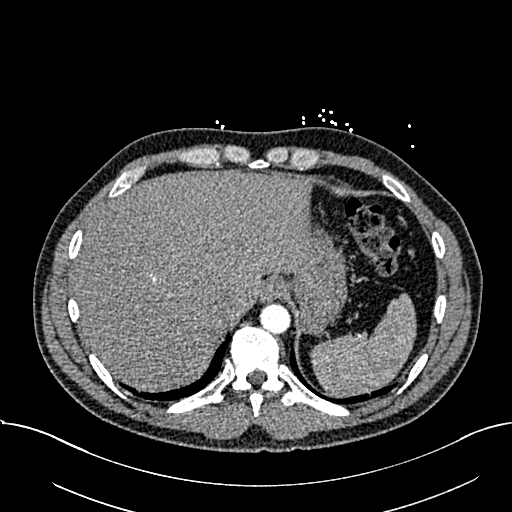
[im 77/293  lung]
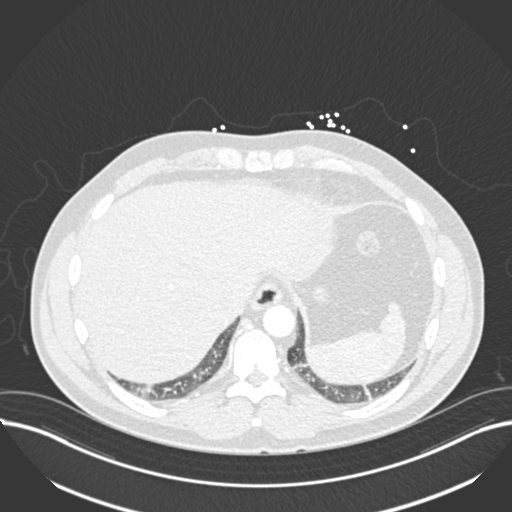
[im 93/293  mediastinal]
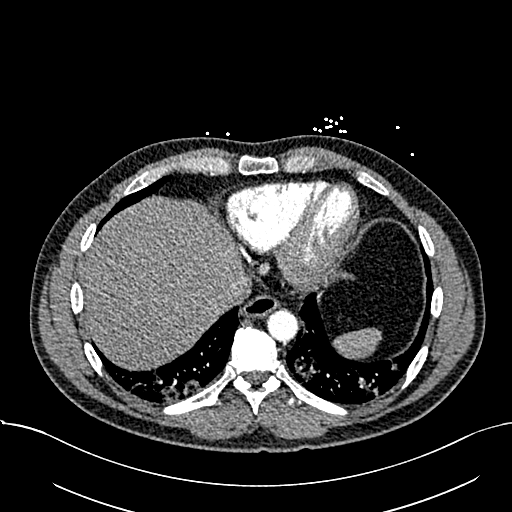
[im 108/293  lung]
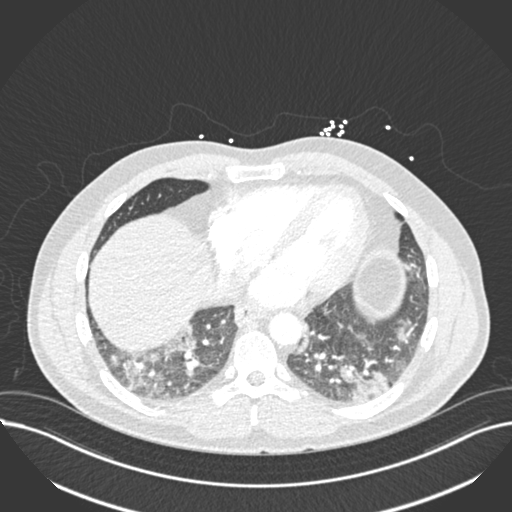
[im 123/293  mediastinal]
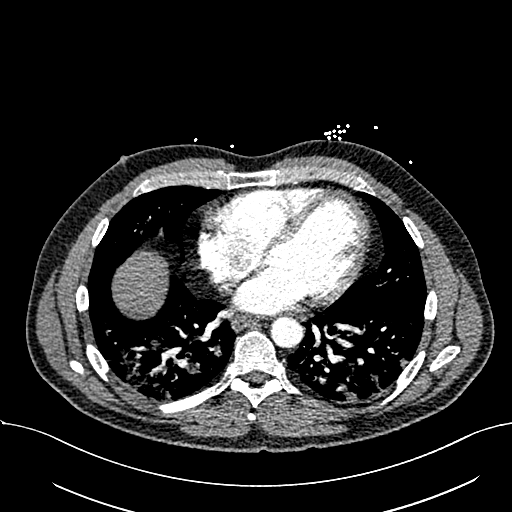
[im 154/293  lung]
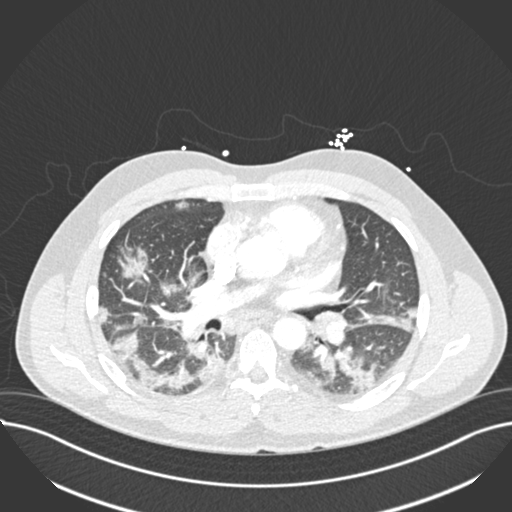
[im 170/293  mediastinal]
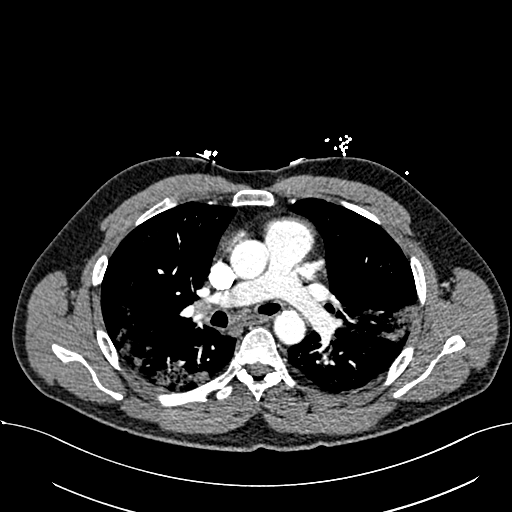
[im 185/293  lung]
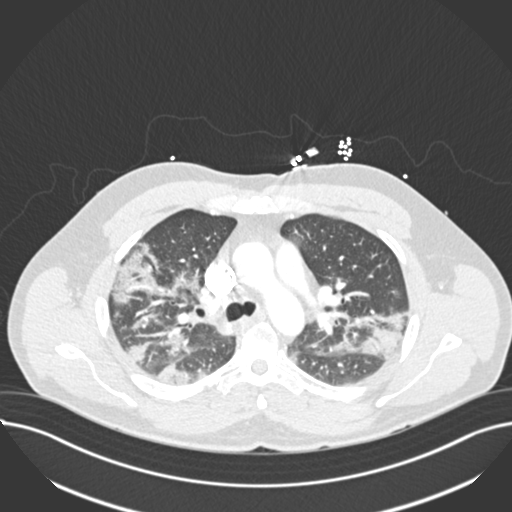
[im 200/293  mediastinal]
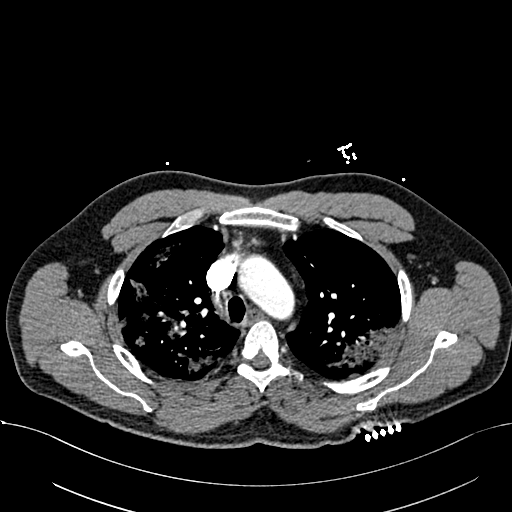
[im 216/293  lung]
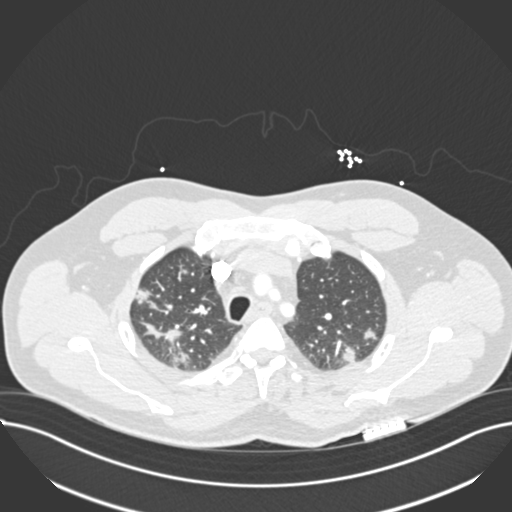
[im 231/293  mediastinal]
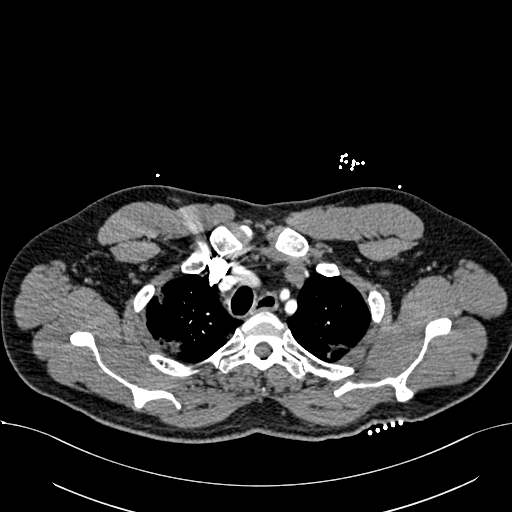
[im 246/293  lung]
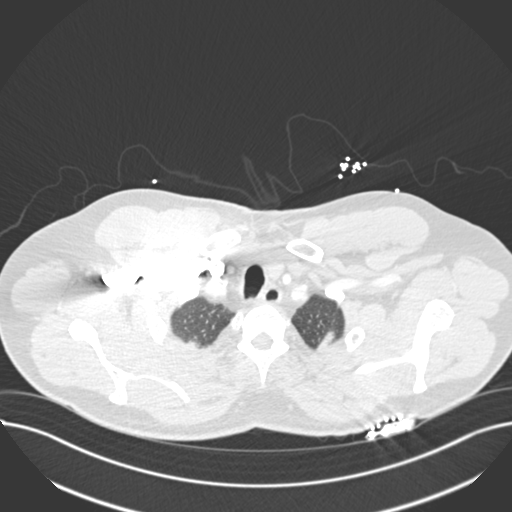
[im 262/293  mediastinal]
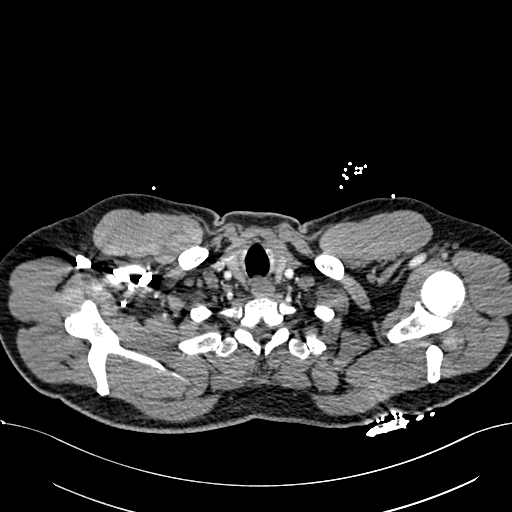
[im 277/293  lung]
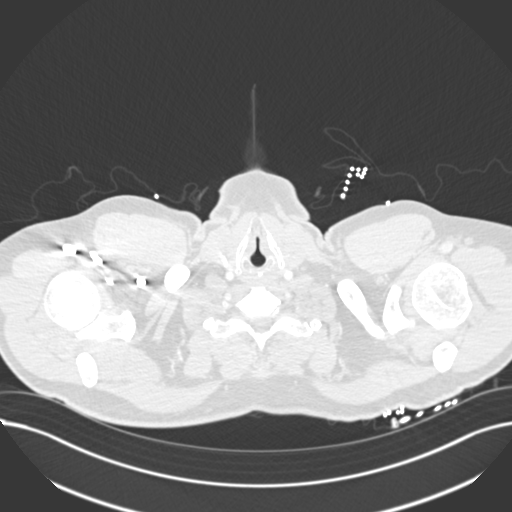

[18 of 36 positions shown; findings below may reference images not displayed]

FINDINGS: Cardiovascular: Contrast injection is sufficient to demonstrate
satisfactory opacification of the pulmonary arteries to the
segmental level. There is no pulmonary embolus or evidence of right
heart strain. The size of the main pulmonary artery is normal. Heart
size is normal, with no pericardial effusion. The course and caliber
of the aorta are normal. There is no atherosclerotic calcification.
Opacification decreased due to pulmonary arterial phase contrast
bolus timing.

Mediastinum/Nodes:

-- No mediastinal lymphadenopathy.

-- No hilar lymphadenopathy.

-- No axillary lymphadenopathy.

-- No supraclavicular lymphadenopathy.

-- Normal thyroid gland where visualized.

-  Unremarkable esophagus.

Lungs/Pleura: There are diffuse bilateral ground-glass airspace
opacities. There is no pneumothorax. No significant pleural
effusion. The trachea is unremarkable.

Upper Abdomen: Contrast bolus timing is not optimized for evaluation
of the abdominal organs. The visualized portions of the organs of
the upper abdomen are normal.

Musculoskeletal: No chest wall abnormality. No bony spinal canal
stenosis.

Review of the MIP images confirms the above findings.
IMPRESSION: 1. No evidence of acute pulmonary embolus.
2. Diffuse bilateral ground-glass airspace opacities, consistent
with the patient's history of viral pneumonia.

## 2022-05-06 ENCOUNTER — Encounter: Payer: Self-pay | Admitting: Internal Medicine

## 2022-05-06 NOTE — Patient Instructions (Signed)

## 2022-05-06 NOTE — Progress Notes (Unsigned)
Annual  Screening/Preventative Visit  & Comprehensive Evaluation & Examination   Future Appointments  Date Time Provider Department  05/07/2022  3:00 PM Unk Pinto, MD [facility]  05/18/2023  3:00 PM Unk Pinto, MD [facility]            This very nice 52 y.o.  DWM  with HTN, HLD, Prediabetes and Vitamin D Deficiency presents for a Screening /Preventative Visit & comprehensive evaluation and management of multiple medical co-morbidities.   A sleep study in 2008 showed "mild Apnea" w/CPAP not indicated.  Patient is followed by Dr Toy Care for ADD.  Recent Labs at [facility] - 11/29/2-023 -  found Nl CBC, CMET, TSH = 1.34, Nl PSA 0.371.       Patient had Colonoscopy in 2006 at age 27 yo and last colonoscopy was in 2014 recommending 5 yr   f/u for surveillance. On Mar 14, 2022 , patient has Colonoscopy at [facility]  clinics.      Labile HTN predates circa  2018. Patient's BP has been controlled and today's BP is at goal -  104/78. Patient denies any cardiac symptoms as chest pain, palpitations, shortness of breath, dizziness or ankle swelling.      Patient's hyperlipidemia is not controlled with diet and patient alleges severe intolerance to statins.  In Sept 2021, he was prescribed Nexlizet which he never picked up as he thought that it was a Statin.  Last lipids were  not at goal :  Lab Results  Component Value Date   CHOL 209 (H) 11/07/2021   HDL 35 (L) 11/07/2021   LDLCALC not calculated.  11/07/2021   TRIG 468 (H) 11/07/2021   CHOLHDL 6.0 (H) 11/07/2021   Recent Labs at [facility] - 11/29/2-023 -  found TChol = 204, LDL = 130, TG = 170      Patient has hx/o prediabetes (A1c 5.9% /2013) and patient denies reactive hypoglycemic symptoms, visual blurring, diabetic polys or paresthesias. Last A1c was normal & at goal :   Lab Results  Component Value Date   HGBA1C 5.3 11/07/2021   Recent Labs at [facility] - 03/26/2022  found A1c = 5.9%      Finally, patient has history of  Vitamin D Deficiency ("39" /2013) and last vitamin D was at goal :   Lab Results  Component Value Date   VD25OH 101 (H) 11/07/2021      Recent Labs at [facility] - 11/29/2-023 -  found elevated  Vit D = 135 & he was advised to decrease dose.    Current Outpatient Medications on File Prior to Visit  Medication Sig   acyclovir 800 MG tablet TAKE 1 TABLET 4 TIMES DAILY AS NEEDED.   cetirizine 10 MG tablet Take  daily.   VITAMIN D  5,000 UNIT/ML LIQD Take daily.   ANUSOL-HC) 2.5 % rectal crm APPLY RECTALLY 2 TO 4 TIMES DAILY   VASCEPA 1 g capsule Take 2 capsules 2  times daily.   Apple cider 500 mg capsule  daily.   Red Yeast Rice Extract   Takes 4 capsules per week.   sildenafil 20 MG tablet Take  1 to 5 tablets  Daily  as needed    zinc 50 MG tablet Take daily.     Allergies  Allergen Reactions   Pravastatin Sodium Neurological side effects in 01/2013       Amoxicillin Anhydrous     Doxycycline    Eggs Or Egg-Derived Products    Keflex  Levaquin     Neomycin    Sulfa Antibiotics      Past Medical History:  Diagnosis Date   ADD (attention deficit disorder)    Allergy    Arthritis    Family history of malignant neoplasm of gastrointestinal tract    Hyperlipemia      Health Maintenance  Topic Date Due   Pneumococcal Vaccine  Never done   COLONOSCOPY 10/04/2017   INFLUENZA VACCINE  Never done   COVID-19 Vaccine  11/26/2020   TETANUS/TDAP  10/16/2028   Hepatitis C Screening  Completed   HIV Screening  Completed   HPV VACCINES  Aged Out     Immunization History  Administered Date(s) Administered   PFIZER-SARS-COV-2 Vacc 11/05/2020   PPD Test 09/18/2017, 03/17/2019, 04/16/2020   Td 04/28/2008   Tdap 12/27/2009, 10/17/2018    Last Colon - 10/04/2012 - Dr Elisabeth Most   - Recc 5 year f/u due June 2019 - patient aware he needs to reschedule   Past Surgical History:  Procedure Laterality Date   NASAL FRACTURE SURGERY     TONSILLECTOMY     WISDOM TOOTH EXTRACTION        Family History  Problem Relation Age of Onset   Colon polyps Mother    Colon cancer Father        x3   Clotting disorder Father    Prostate cancer Brother    Clotting disorder Brother      Social History   Tobacco Use   Smoking status: Never   Smokeless tobacco: Never  Substance Use Topics   Alcohol use: Yes    Comment: 1-2 weekl    Drug use: No      ROS Constitutional: Denies fever, chills, weight loss/gain, headaches, insomnia,  night sweats or change in appetite. Does c/o fatigue. Eyes: Denies redness, blurred vision, diplopia, discharge, itchy or watery eyes.  ENT: Denies discharge, congestion, post nasal drip, epistaxis, sore throat, earache, hearing loss, dental pain, Tinnitus, Vertigo, Sinus pain or snoring.  Cardio: Denies chest pain, palpitations, irregular heartbeat, syncope, dyspnea, diaphoresis, orthopnea, PND, claudication or edema Respiratory: denies cough, dyspnea, DOE, pleurisy, hoarseness, laryngitis or wheezing.  Gastrointestinal: Denies dysphagia, heartburn, reflux, water brash, pain, cramps, nausea, vomiting, bloating, diarrhea, constipation, hematemesis, melena, hematochezia, jaundice or hemorrhoids Genitourinary: Denies dysuria, frequency, urgency, nocturia, hesitancy, discharge, hematuria or flank pain Musculoskeletal: Denies arthralgia, myalgia, stiffness, Jt. Swelling, pain, limp or strain/sprain. Denies Falls. Skin: Denies puritis, rash, hives, warts, acne, eczema or change in skin lesion Neuro: No weakness, tremor, incoordination, spasms, paresthesia or pain Psychiatric: Denies confusion, memory loss or sensory loss. Denies Depression. Endocrine: Denies change in weight, skin, hair change, nocturia, and paresthesia, diabetic polys, visual blurring or hyper / hypo glycemic episodes.  Heme/Lymph: No excessive bleeding, bruising or enlarged lymph nodes.   Physical Exam  BP 104/78   Pulse 98   Temp 98.1 F (36.7 C)   Resp 16   Ht 6'  (1.829 m)   Wt 217 lb 9.6 oz (98.7 kg)   SpO2 95%   BMI 29.51 kg/m   General Appearance: Well nourished and well groomed and in no apparent distress.  Eyes: PERRLA, EOMs, conjunctiva no swelling or erythema, normal fundi and vessels. Sinuses: No frontal/maxillary tenderness ENT/Mouth: EACs patent / TMs  nl. Nares clear without erythema, swelling, mucoid exudates. Oral hygiene is good. No erythema, swelling, or exudate. Tongue normal, non-obstructing. Tonsils not swollen or erythematous. Hearing normal.  Neck: Supple, thyroid not palpable. No bruits,  nodes or JVD. Respiratory: Respiratory effort normal.  BS equal and clear bilateral without rales, rhonci, wheezing or stridor. Cardio: Heart sounds are normal with regular rate and rhythm and no murmurs, rubs or gallops. Peripheral pulses are normal and equal bilaterally without edema. No aortic or femoral bruits. Chest: symmetric with normal excursions and percussion.  Abdomen: Soft, with Nl bowel sounds. Nontender, no guarding, rebound, hernias, masses, or organomegaly.  Lymphatics: Non tender without lymphadenopathy.  Musculoskeletal: Full ROM all peripheral extremities, joint stability, 5/5 strength, and normal gait. Skin: Warm and dry without rashes, lesions, cyanosis, clubbing or  ecchymosis.  Neuro: Cranial nerves intact, reflexes equal bilaterally. Normal muscle tone, no cerebellar symptoms. Sensation intact.  Pysch: Alert and oriented X 3 with normal affect, insight and judgment appropriate.   Assessment and Plan  1. Annual Preventative/Screening Exam    2. Elevated BP without diagnosis of hypertension  - EKG 12-Lead - [place], RETROPERITNL ABD,  LTD - Urinalysis, Routine w reflex microscopic - CBC with Differential/Platelet - COMPLETE METABOLIC PANEL WITH GFR - Magnesium - TSH - Microalbumin / creatinine urine ratio  3. Hyperlipidemia, mixed  - EKG 12-Lead - [place], RETROPERITNL ABD,  LTD - Lipid panel - TSH  4. Abnormal  glucose  - EKG 12-Lead - [place], RETROPERITNL ABD,  LTD - Hemoglobin A1c - Insulin, random  5. Vitamin D deficiency  - VITAMIN D 25 Hydroxy   6. Attention deficit hyperactivity disorder (ADHD), predominantly inattentive type   7. Screening for colorectal cancer  - POC Hemoccult Bld/Stl   8. Prostate cancer screening  - PSA  9. Screening examination for pulmonary tuberculosis  - TB Skin Test  10. Screening for heart disease  - EKG 12-Lead  11. FHx: heart disease  - EKG 12-Lead - [place], RETROPERITNL ABD,  LTD  12. Screening for AAA (aortic abdominal aneurysm)  - [place], RETROPERITNL ABD,  LTD  13. Fatigue, unspecified type  - Iron, Total/Total Iron Binding Cap - Vitamin B12 - Testosterone - CBC with Differential/Platelet - TSH  14. Medication management  - Urinalysis, Routine w reflex microscopic - CBC with Differential/Platelet - COMPLETE METABOLIC PANEL WITH GFR - Magnesium - Lipid panel - TSH - Hemoglobin A1c - Insulin, random - VITAMIN D 25 Hydroxy - Microalbumin / creatinine urine ratio          Patient was counseled in prudent diet, weight control to achieve/maintain BMI less than 25, BP monitoring, regular exercise and medications as discussed.  Discussed med effects and SE's. Routine screening labs and tests as requested with regular follow-up as recommended. Over 40 minutes of exam, counseling, chart review and high complex critical decision making was performed   Marcus Maw, MD

## 2022-05-07 ENCOUNTER — Other Ambulatory Visit: Payer: Self-pay | Admitting: Internal Medicine

## 2022-05-07 ENCOUNTER — Encounter: Payer: Self-pay | Admitting: Internal Medicine

## 2022-05-07 ENCOUNTER — Ambulatory Visit (INDEPENDENT_AMBULATORY_CARE_PROVIDER_SITE_OTHER): Payer: No Typology Code available for payment source | Admitting: Internal Medicine

## 2022-05-07 VITALS — BP 104/78 | HR 98 | Temp 98.1°F | Resp 16 | Ht 72.0 in | Wt 217.6 lb

## 2022-05-07 DIAGNOSIS — Z0001 Encounter for general adult medical examination with abnormal findings: Secondary | ICD-10-CM

## 2022-05-07 DIAGNOSIS — Z1212 Encounter for screening for malignant neoplasm of rectum: Secondary | ICD-10-CM

## 2022-05-07 DIAGNOSIS — Z79899 Other long term (current) drug therapy: Secondary | ICD-10-CM

## 2022-05-07 DIAGNOSIS — N529 Male erectile dysfunction, unspecified: Secondary | ICD-10-CM

## 2022-05-07 DIAGNOSIS — F900 Attention-deficit hyperactivity disorder, predominantly inattentive type: Secondary | ICD-10-CM

## 2022-05-07 DIAGNOSIS — Z136 Encounter for screening for cardiovascular disorders: Secondary | ICD-10-CM | POA: Diagnosis not present

## 2022-05-07 DIAGNOSIS — E782 Mixed hyperlipidemia: Secondary | ICD-10-CM

## 2022-05-07 DIAGNOSIS — Z125 Encounter for screening for malignant neoplasm of prostate: Secondary | ICD-10-CM

## 2022-05-07 DIAGNOSIS — Z1211 Encounter for screening for malignant neoplasm of colon: Secondary | ICD-10-CM

## 2022-05-07 DIAGNOSIS — B009 Herpesviral infection, unspecified: Secondary | ICD-10-CM

## 2022-05-07 DIAGNOSIS — R030 Elevated blood-pressure reading, without diagnosis of hypertension: Secondary | ICD-10-CM | POA: Diagnosis not present

## 2022-05-07 DIAGNOSIS — I700 Atherosclerosis of aorta: Secondary | ICD-10-CM | POA: Diagnosis not present

## 2022-05-07 DIAGNOSIS — Z8249 Family history of ischemic heart disease and other diseases of the circulatory system: Secondary | ICD-10-CM

## 2022-05-07 DIAGNOSIS — Z111 Encounter for screening for respiratory tuberculosis: Secondary | ICD-10-CM | POA: Diagnosis not present

## 2022-05-07 DIAGNOSIS — R5383 Other fatigue: Secondary | ICD-10-CM

## 2022-05-07 DIAGNOSIS — Z0000 Encounter for general adult medical examination without abnormal findings: Secondary | ICD-10-CM

## 2022-05-07 DIAGNOSIS — R7309 Other abnormal glucose: Secondary | ICD-10-CM

## 2022-05-07 DIAGNOSIS — E559 Vitamin D deficiency, unspecified: Secondary | ICD-10-CM

## 2022-05-07 DIAGNOSIS — R052 Subacute cough: Secondary | ICD-10-CM

## 2022-05-07 MED ORDER — DEXAMETHASONE 2 MG PO TABS: ORAL_TABLET | ORAL | 0 refills | Status: DC

## 2022-05-07 MED ORDER — BENZONATATE 200 MG PO CAPS: ORAL_CAPSULE | ORAL | 1 refills | Status: DC

## 2022-05-07 MED ORDER — VARDENAFIL HCL 20 MG PO TABS: ORAL_TABLET | ORAL | 0 refills | Status: DC

## 2022-05-09 ENCOUNTER — Encounter: Payer: Self-pay | Admitting: Internal Medicine

## 2022-06-16 ENCOUNTER — Other Ambulatory Visit: Payer: Self-pay | Admitting: Internal Medicine

## 2022-06-16 DIAGNOSIS — K649 Unspecified hemorrhoids: Secondary | ICD-10-CM

## 2022-06-25 ENCOUNTER — Other Ambulatory Visit: Payer: Self-pay | Admitting: Internal Medicine

## 2022-06-25 DIAGNOSIS — E782 Mixed hyperlipidemia: Secondary | ICD-10-CM

## 2022-12-09 ENCOUNTER — Other Ambulatory Visit: Payer: Self-pay | Admitting: Internal Medicine

## 2022-12-25 ENCOUNTER — Other Ambulatory Visit: Payer: Self-pay | Admitting: Internal Medicine

## 2022-12-25 DIAGNOSIS — N529 Male erectile dysfunction, unspecified: Secondary | ICD-10-CM

## 2022-12-25 MED ORDER — SILDENAFIL CITRATE 100 MG PO TABS: ORAL_TABLET | ORAL | 3 refills | Status: AC

## 2023-05-06 ENCOUNTER — Ambulatory Visit
Admission: EM | Admit: 2023-05-06 | Discharge: 2023-05-06 | Disposition: A | Discharge status: Home / Self Care | Payer: No Typology Code available for payment source | Attending: Physician Assistant | Admitting: Physician Assistant

## 2023-05-06 ENCOUNTER — Ambulatory Visit (INDEPENDENT_AMBULATORY_CARE_PROVIDER_SITE_OTHER): Payer: No Typology Code available for payment source

## 2023-05-06 DIAGNOSIS — R051 Acute cough: Secondary | ICD-10-CM | POA: Diagnosis present

## 2023-05-06 DIAGNOSIS — J029 Acute pharyngitis, unspecified: Secondary | ICD-10-CM | POA: Diagnosis present

## 2023-05-06 DIAGNOSIS — R5383 Other fatigue: Secondary | ICD-10-CM | POA: Insufficient documentation

## 2023-05-06 DIAGNOSIS — G4700 Insomnia, unspecified: Secondary | ICD-10-CM | POA: Insufficient documentation

## 2023-05-06 LAB — COMPREHENSIVE METABOLIC PANEL
ALT: 21 U/L (ref 0–44)
AST: 20 U/L (ref 15–41)
Albumin: 4.5 g/dL (ref 3.5–5.0)
Alkaline Phosphatase: 57 U/L (ref 38–126)
Anion gap: 9 (ref 5–15)
BUN: 17 mg/dL (ref 6–20)
CO2: 25 mmol/L (ref 22–32)
Calcium: 9.2 mg/dL (ref 8.9–10.3)
Chloride: 101 mmol/L (ref 98–111)
Creatinine, Ser: 1.11 mg/dL (ref 0.61–1.24)
GFR, Estimated: >60 mL/min (ref >60)
Glucose, Bld: 105 mg/dL — ABNORMAL HIGH (ref 70–99)
Potassium: 4.1 mmol/L (ref 3.5–5.1)
Sodium: 135 mmol/L (ref 135–145)
Total Bilirubin: 1.1 mg/dL (ref 0.0–1.2)
Total Protein: 8.5 g/dL — ABNORMAL HIGH (ref 6.5–8.1)

## 2023-05-06 LAB — CBC WITH DIFFERENTIAL/PLATELET
Abs Immature Granulocytes: 0.01 10*3/uL (ref 0.00–0.07)
Basophils Absolute: 0.1 10*3/uL (ref 0.0–0.1)
Basophils Relative: 1 %
Eosinophils Absolute: 0.1 10*3/uL (ref 0.0–0.5)
Eosinophils Relative: 1 %
HCT: 44.4 % (ref 39.0–52.0)
Hemoglobin: 15.4 g/dL (ref 13.0–17.0)
Immature Granulocytes: 0 %
Lymphocytes Relative: 11 %
Lymphs Abs: 1 10*3/uL (ref 0.7–4.0)
MCH: 31.1 pg (ref 26.0–34.0)
MCHC: 34.7 g/dL (ref 30.0–36.0)
MCV: 89.7 fL (ref 80.0–100.0)
Monocytes Absolute: 0.7 10*3/uL (ref 0.1–1.0)
Monocytes Relative: 7 %
Neutro Abs: 7.6 10*3/uL (ref 1.7–7.7)
Neutrophils Relative %: 80 %
Platelets: 237 10*3/uL (ref 150–400)
RBC: 4.95 MIL/uL (ref 4.22–5.81)
RDW: 12 % (ref 11.5–15.5)
WBC: 9.4 10*3/uL (ref 4.0–10.5)
nRBC: 0 % (ref 0.0–0.2)

## 2023-05-06 LAB — MONONUCLEOSIS SCREEN: Mono Screen: NEGATIVE

## 2023-05-06 MED ORDER — TRAZODONE HCL 50 MG PO TABS: 50.0000 mg | ORAL_TABLET | Freq: Every evening | ORAL | 0 refills | Status: AC | PRN

## 2023-05-06 NOTE — ED Provider Notes (Signed)
 Marcus Jones    CSN: [id number] Arrival date & time: 05/06/23  1646      History   Chief Complaint Chief Complaint  Patient presents with   Insomnia   Generalized Body Aches   Chills    HPI Marcus Jones is a 53 y.o. male.   Patient reports flu like symptoms about 1.5 weeks ago--states he had chills, fatigue, felt feverish with cough and congestion. Those symptoms lasted for 4-5 days and then symptoms improved.   He says over the past few days he has had increased cough, body aches, congestion, sore throat, post nasal drainage, and neck pain. Reports increased anxiety and insomnia x 3 days. Has been traveling a lot for work, is a drug rep. No fever, chest pain, shortness of breath.  Patient does not believe he has COVID and declines have a COVID test.  He has had COVID a couple of times and the symptoms feel different to him.  Patient reports exposure to mono a couple of weeks ago by his daughter.   Has tried Benadryl , Melatonin and Magnesium without relief.  Patient says he does not feel that tired and just feels wired. He reports that he got very wired on ADHD meds a couple of years ago and was given trazodone  which helped him sleep. He does not take ADHD meds anymore. Denies history of anxiety, depression, or mental health problems.   HPI  Past Medical History:  Diagnosis Date   ADD (attention deficit disorder)    Allergy    Arthritis    Family history of malignant neoplasm of gastrointestinal tract    Hyperlipemia     Patient Active Problem List   Diagnosis Date Noted   Labile hypertension 11/07/2021   Severe obesity (BMI 35.0-39.9) with comorbidity (HCC) 05/02/2021   Impingement syndrome of right shoulder 03/24/2016   Sprain of calcaneofibular ligament of right ankle 03/04/2016   Elevated BP 08/09/2014   Abnormal glucose 08/09/2014   Medication management 08/09/2014   Vitamin D  deficiency 08/09/2014   Hyperlipidemia, mixed 06/29/2013   ADD  (attention deficit disorder) 06/29/2013   Paresthesias 02/02/2013    Past Surgical History:  Procedure Laterality Date   NASAL FRACTURE SURGERY     TONSILLECTOMY     WISDOM TOOTH EXTRACTION         Home Medications    Prior to Admission medications   Medication Sig Start Date End Date Taking? Authorizing Provider  acyclovir  (ZOVIRAX ) 800 MG tablet TAKE 1 TABLET BY MOUTH 4 TIMES DAILY AS NEEDED. 05/07/22  Yes Cranford, Tonya, NP  cetirizine (ZYRTEC) 10 MG tablet Take 10 mg by mouth daily.   Yes [provider]  Cholecalciferol (VITAMIN D3) 5000 UNIT/ML LIQD Take by mouth daily.   Yes [provider]  hydrocortisone  (ANUSOL -HC) 2.5 % rectal cream APPLY RECTALLY 2 TO 4 TIMES DAILY. 06/16/22  Yes Wilkinson, Dana E, NP  NEXLETOL  180 MG TABS Take 1 tablet Daily for Cholesterol 06/25/22  Yes Wilkinson, Dana E, NP  sildenafil  (VIAGRA ) 100 MG tablet Take  1/2 to 1 tablet  Daily if needed for XXXX                                               /  Take                        by                         mouth 12/25/22  Yes Tonita Fallow, MD  traZODone  (DESYREL ) 50 MG tablet Take 1 tablet (50 mg total) by mouth at bedtime as needed for sleep. 05/06/23 06/05/23 Yes Arvis Huxley B, PA-C  zinc gluconate 50 MG tablet Take 50 mg by mouth daily.   Yes [provider]    Family History Family History  Problem Relation Age of Onset   Colon polyps Mother    Colon cancer Father        x3   Clotting disorder Father    Prostate cancer Brother    Clotting disorder Brother     Social History Social History   Tobacco Use   Smoking status: Never   Smokeless tobacco: Never  Substance Use Topics   Alcohol use: Yes    Comment: 1-2 weekl    Drug use: No     Allergies   Pravastatin sodium, Levofloxacin, Neomycin, Darunavir, Egg-derived products, Pravastatin, Sulfa antibiotics, Amoxicillin, Bee venom, and Cephalexin   Review of Systems Review of  Systems  Constitutional:  Positive for fatigue. Negative for fever.  HENT:  Positive for congestion, postnasal drip, rhinorrhea and sore throat. Negative for sinus pressure and sinus pain.   Respiratory:  Positive for cough. Negative for shortness of breath.   Cardiovascular:  Negative for chest pain.  Gastrointestinal:  Negative for abdominal pain, diarrhea, nausea and vomiting.  Musculoskeletal:  Positive for myalgias and neck pain. Negative for neck stiffness.  Neurological:  Negative for weakness, light-headedness and headaches.  Hematological:  Negative for adenopathy.  Psychiatric/Behavioral:  Positive for sleep disturbance.      Physical Exam Triage Vital Signs ED Triage Vitals  Encounter Vitals Group     BP 05/06/23 1811 116/83     Systolic BP Percentile --      Diastolic BP Percentile --      Pulse Rate 05/06/23 1811 76     Resp 05/06/23 1811 16     Temp 05/06/23 1811 98.3 F (36.8 C)     Temp Source 05/06/23 1811 Oral     SpO2 05/06/23 1811 96 %     Weight 05/06/23 1810 205 lb (93 kg)     Height 05/06/23 1810 5' 11 (1.803 m)     Head Circumference --      Peak Flow --      Pain Score 05/06/23 1817 5     Pain Loc --      Pain Education --      Exclude from Growth Chart --    No data found.  Updated Vital Signs BP 116/83 (BP Location: Left Arm)   Pulse 76   Temp 98.3 F (36.8 C) (Oral)   Resp 16   Ht 5' 11 (1.803 m)   Wt 205 lb (93 kg)   SpO2 96%   BMI 28.59 kg/m   Physical Exam Vitals and nursing note reviewed.  Constitutional:      General: He is not in acute distress.    Appearance: Normal appearance. He is well-developed. He is not ill-appearing.  HENT:     Head: Normocephalic and atraumatic.     Nose: Congestion present.     Mouth/Throat:     Mouth: Mucous membranes are  moist.     Pharynx: Oropharynx is clear.  Eyes:     General: No scleral icterus.    Conjunctiva/sclera: Conjunctivae normal.  Cardiovascular:     Rate and Rhythm: Normal  rate and regular rhythm.     Heart sounds: Normal heart sounds.  Pulmonary:     Effort: Pulmonary effort is normal. No respiratory distress.     Breath sounds: Normal breath sounds.  Abdominal:     Palpations: Abdomen is soft.     Tenderness: There is no abdominal tenderness.  Musculoskeletal:     Cervical back: Neck supple.  Skin:    General: Skin is warm and dry.     Capillary Refill: Capillary refill takes less than 2 seconds.  Neurological:     General: No focal deficit present.     Mental Status: He is alert. Mental status is at baseline.     Motor: No weakness.     Gait: Gait normal.  Psychiatric:        Mood and Affect: Mood normal.        Behavior: Behavior normal.      UC Treatments / Results  Labs (all labs ordered are listed, but only abnormal results are displayed) Labs Reviewed  COMPREHENSIVE METABOLIC PANEL - Abnormal; Notable for the following components:      Result Value   Glucose, Bld 105 (*)    Total Protein 8.5 (*)    All other components within normal limits  CBC WITH DIFFERENTIAL/PLATELET  MONONUCLEOSIS SCREEN  TSH    EKG   Radiology DG Chest 2 View Result Date: 05/06/2023 CLINICAL DATA:  Cough EXAM: CHEST - 2 VIEW COMPARISON:  01/16/2020 FINDINGS: The heart size and mediastinal contours are within normal limits. Both lungs are clear. The visualized skeletal structures are unremarkable. IMPRESSION: No active cardiopulmonary disease. Electronically Signed   By: Franky Crease M.D.   On: 05/06/2023 20:06    Procedures Procedures (including critical Jones time)  Medications Ordered in UC Medications - No data to display  Initial Impression / Assessment and Plan / UC Course  I have reviewed the triage vital signs and the nursing notes.  Pertinent labs & imaging results that were available during my Jones of the patient were reviewed by me and considered in my medical decision making (see chart for details).   53 year old male presents for  difficulty sleeping, cough, congestion, postnasal drainage, fatigue, body aches over the past few days.  Reports similar symptoms without the difficulty sleeping/insomnia about a week and a half ago after he was exposed to mono by his daughter.  Has not had any fevers, chest pain, shortness of breath, abdominal pain.  Has tried Benadryl , melatonin and magnesium over-the-counter for sleep without relief.  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  He does report having some success with trazodone  in the past.  Vitals are all normal and stable and he is overall well-appearing.  On exam has mild nasal congestion.  Throat is clear.  Chest clear to auscultation.  Abdomen soft and nontender.  Obtaining CBC, CMP, TSH, mono testing and chest x-ray.  Patient declined a COVID test.  Wet read chest x-ray negative.  CBC, CMP and mono without any significant findings.  Pending TSH and chest x-ray report.  Reviewed results with patient.  Suspect his insomnia is related to illness.  Sent trazodone  to pharmacy.  Discussed how to use the medication.  Reviewed good sleep hygiene.  Increase rest and fluids.  Reviewed return precautions for illness.  Advised following up with PCP if insomnia issues continue.  Radiology reports on chest x-ray is within normal limits.  TSH is normal.  Called to discuss results with patient on 05/07/2023.  Patient reports the trazodone  has helped him sleep.  He states that when he got home he took a COVID test and it was definitely positive.  Reports the test was expired but it was definitely positive.  He has an appointment with his primary Jones provider tomorrow and is going to ask him about possibly starting on ivermectin or hydroxychloroquine when which he has at home.  Explained to patient that there is not a lot of research behind that being effective and Jones is mostly supportive.  However, keep appointment  PCP.  I did review the current CDC guidelines, isolation protocol and ED precautions for COVID.   Final Clinical Impressions(s) / UC Diagnoses   Final diagnoses:  Acute cough  Insomnia, unspecified type  Other fatigue  Sore throat     Discharge Instructions      -Mono is negative.  CBC and CMP are essentially normal.  Thyroid  test is pending. - Chest x-ray results are also pending.  I will contact you once I receive these results tomorrow. - I sent the trazodone  as needed for insomnia.  Standard dose is about 50 mg.  May consider taking half of that and if still having difficulty falling asleep take the other half.  If you continue to have insomnia issues that seem to be unrelated to illness, please follow-up with your PCP.     ED Prescriptions     Medication Sig Dispense Auth. Provider   traZODone  (DESYREL ) 50 MG tablet Take 1 tablet (50 mg total) by mouth at bedtime as needed for sleep. 30 tablet Arvis Jolan NOVAK, PA-C      I have reviewed the PDMP during this encounter.   Arvis Jolan NOVAK, PA-C 05/07/23 [phone]

## 2023-05-06 NOTE — Discharge Instructions (Addendum)
-  Mono is negative.  CBC and CMP are essentially normal.  Thyroid  test is pending. - Chest x-ray results are also pending.  I will contact you once I receive these results tomorrow. - I sent the trazodone  as needed for insomnia.  Standard dose is about 50 mg.  May consider taking half of that and if still having difficulty falling asleep take the other half.  If you continue to have insomnia issues that seem to be unrelated to illness, please follow-up with your PCP.

## 2023-05-06 NOTE — ED Triage Notes (Signed)
 Pt c/o loss of sleep x3 days. States around [place] daughter came into town who had mono. The day after he developed flu like sx's which subsided but returned. Today he felt feverish & body aches.

## 2023-05-07 LAB — TSH: TSH: 1.361 u[IU]/mL (ref 0.350–4.500)

## 2023-05-08 ENCOUNTER — Other Ambulatory Visit: Payer: Self-pay

## 2023-05-08 ENCOUNTER — Ambulatory Visit (INDEPENDENT_AMBULATORY_CARE_PROVIDER_SITE_OTHER): Payer: BC Managed Care – PPO | Admitting: Nurse Practitioner

## 2023-05-08 ENCOUNTER — Encounter: Payer: Self-pay | Admitting: Nurse Practitioner

## 2023-05-08 VITALS — BP 120/80 | HR 93 | Temp 98.0°F | Ht 71.0 in | Wt 217.2 lb

## 2023-05-08 DIAGNOSIS — U071 COVID-19: Secondary | ICD-10-CM | POA: Diagnosis not present

## 2023-05-08 MED ORDER — PROMETHAZINE-DM 6.25-15 MG/5ML PO SYRP: 5.0000 mL | ORAL_SOLUTION | Freq: Four times a day (QID) | ORAL | 0 refills | Status: AC | PRN

## 2023-05-08 MED ORDER — DEXAMETHASONE 4 MG PO TABS: ORAL_TABLET | ORAL | 0 refills | Status: AC

## 2023-05-08 MED ORDER — AZITHROMYCIN 250 MG PO TABS: ORAL_TABLET | ORAL | 1 refills | Status: AC

## 2023-05-08 MED ORDER — ALBUTEROL SULFATE HFA 108 (90 BASE) MCG/ACT IN AERS: 2.0000 | INHALATION_SPRAY | Freq: Four times a day (QID) | RESPIRATORY_TRACT | 2 refills | Status: AC | PRN

## 2023-05-08 NOTE — Progress Notes (Signed)
 Assessment and Plan:  Marcus Jones was seen today for an episodic visit.  Diagnoses and all order for this visit:  1. COVID-19 (Primary) Take tylenol PRN temp 101+ Push hydration Sx supportive therapy suggested Follow up via mychart or telephone if needed Report to ER for any increase in difficulty breathing.  - azithromycin  (ZITHROMAX ) 250 MG tablet; Take 2 tablets on  Day 1,  followed by 1 tablet  daily for 4 more days    for Sinusitis  /Bronchitis  Dispense: 6 each; Refill: 1 - dexamethasone  (DECADRON ) 4 MG tablet; Take 1 tab 3 x /day for 2 days, then 2 x /day for 2  Days,  then 1 tab daily  Dispense: 13 tablet; Refill: 0 - promethazine -dextromethorphan (PROMETHAZINE -DM) 6.25-15 MG/5ML syrup; Take 5 mLs by mouth 4 (four) times daily as needed for cough.  Dispense: 240 mL; Refill: 0 - albuterol  (VENTOLIN  HFA) 108 (90 Base) MCG/ACT inhaler; Inhale 2 puffs into the lungs every 6 (six) hours as needed for wheezing or shortness of breath.  Dispense: 8 g; Refill: 2  Notify office for further evaluation and treatment, questions or concerns if s/s fail to improve. The risks and benefits of my recommendations, as well as other treatment options were discussed with the patient today. Questions were answered.  Further disposition pending results of labs. Discussed med's effects and SE's.    Over 20 minutes of exam, counseling, chart review, and critical decision making was performed.   Future Appointments  Date Time Provider Department Center  05/19/2023  2:00 PM Tonita Fallow, MD [facility] None    ------------------------------------------------------------------------------------------------------------------   HPI BP 120/80   Pulse 93   Temp 98 F (36.7 C)   Ht 5' 11 (1.803 m)   Wt 217 lb 3.2 oz (98.5 kg)   SpO2 98%   BMI 30.29 kg/m    53 y.o. patient contacted office reporting positive in home Covid test. Reported to [facility] 05/06/23 for flu like symptoms about 1.5 weeks  ago--stated he had chills, fatigue, felt feverish with cough and congestion. Those symptoms lasted for 4-5 days and then symptoms improved. Stated over the past few days he has had increased cough, body aches, congestion, sore throat, post nasal drainage, and neck pain. Reports increased anxiety and insomnia x 3 days. Has been traveling a lot for work, is a drug rep. No fever, chest pain, shortness of breath.    Patient reports exposure to mono a couple of weeks ago by his daughter. Has tried Benadryl , Melatonin and Magnesium without relief.   Patient reported that he felt tired and just feels wired. He reports that he got very wired on ADHD meds a couple of years ago and was given trazodone  which helped him sleep. He does not take ADHD meds anymore. Denies history of anxiety, depression, or mental health problems.    URI sx continue to linger with cough, nasal congestion, fatigue, insomnia.  Work up in ER was negative including CXR, TSH normal. Negative Mono.  He was prescribed Trazodone  for sleep.   Past Medical History:  Diagnosis Date   ADD (attention deficit disorder)    Allergy    Arthritis    Family history of malignant neoplasm of gastrointestinal tract    Hyperlipemia      Allergies  Allergen Reactions   Pravastatin Sodium Other (See Comments)    Neurological side effects in 01/2013   Levofloxacin Swelling   Neomycin Hives and Rash   Darunavir Hives   Egg-Derived Products  Pravastatin Other (See Comments)   Sulfa Antibiotics    Amoxicillin Hives and Rash   Bee Venom Rash   Cephalexin Hives and Rash    Current Outpatient Medications on File Prior to Visit  Medication Sig   acyclovir  (ZOVIRAX ) 800 MG tablet TAKE 1 TABLET BY MOUTH 4 TIMES DAILY AS NEEDED.   cetirizine (ZYRTEC) 10 MG tablet Take 10 mg by mouth daily.   Cholecalciferol (VITAMIN D3) 5000 UNIT/ML LIQD Take by mouth daily.   hydrocortisone  (ANUSOL -HC) 2.5 % rectal cream APPLY RECTALLY 2 TO 4 TIMES DAILY.    sildenafil  (VIAGRA ) 100 MG tablet Take  1/2 to 1 tablet  Daily if needed for XXXX                                               /                        Take                        by                         mouth   traZODone  (DESYREL ) 50 MG tablet Take 1 tablet (50 mg total) by mouth at bedtime as needed for sleep.   zinc gluconate 50 MG tablet Take 50 mg by mouth daily.   NEXLETOL  180 MG TABS Take 1 tablet Daily for Cholesterol (Patient not taking: Reported on 05/08/2023)   No current facility-administered medications on file prior to visit.    ROS: all negative except what is noted in the HPI.   Physical Exam:  BP 120/80   Pulse 93   Temp 98 F (36.7 C)   Ht 5' 11 (1.803 m)   Wt 217 lb 3.2 oz (98.5 kg)   SpO2 98%   BMI 30.29 kg/m   General Appearance: NAD.  Awake, conversant and cooperative. Eyes: PERRLA, EOMs intact.  Sclera white.  Conjunctiva without erythema. Sinuses: No frontal/maxillary tenderness.  No nasal discharge. Nares patent.  ENT/Mouth: Ext aud canals clear.  Bilateral TMs w/DOL and without erythema or bulging. Hearing intact.  Posterior pharynx without swelling or exudate.  Tonsils without swelling or erythema.  Neck: Supple.  No masses, nodules or thyromegaly. Respiratory: Effort is regular with non-labored breathing. Breath sounds are equal bilaterally without rales, rhonchi, wheezing or stridor.  Cardio: RRR with no MRGs. Brisk peripheral pulses without edema.  Abdomen: Active BS in all four quadrants.  Soft and non-tender without guarding, rebound tenderness, hernias or masses. Lymphatics: Non tender without lymphadenopathy.  Musculoskeletal: Full ROM, 5/5 strength, normal ambulation.  No clubbing or cyanosis. Skin: Appropriate color for ethnicity. Warm without rashes, lesions, ecchymosis, ulcers.  Neuro: CN II-XII grossly normal. Normal muscle tone without cerebellar symptoms and intact sensation.   Psych: AO X 3,  appropriate mood and affect, insight and  judgment.     BASCOM NECESSARY, NP 12:03 PM [facility]

## 2023-05-08 NOTE — Patient Instructions (Signed)

## 2023-05-11 ENCOUNTER — Telehealth: Payer: Self-pay | Admitting: Nurse Practitioner

## 2023-05-11 ENCOUNTER — Other Ambulatory Visit: Payer: Self-pay | Admitting: Nurse Practitioner

## 2023-05-11 DIAGNOSIS — E782 Mixed hyperlipidemia: Secondary | ICD-10-CM

## 2023-05-11 MED ORDER — NEXLETOL 180 MG PO TABS: ORAL_TABLET | ORAL | 3 refills | Status: AC

## 2023-05-11 NOTE — Telephone Encounter (Signed)
 Patient would like to go back on the Nexletol. Will you please send in a new RX to [facility]  in [place]?

## 2023-05-12 ENCOUNTER — Encounter: Payer: BC Managed Care – PPO | Admitting: Internal Medicine

## 2023-05-13 ENCOUNTER — Telehealth: Payer: Self-pay | Admitting: Nurse Practitioner

## 2023-05-13 NOTE — Telephone Encounter (Signed)
 Pharmacy told patient we need to resend dexamethasone  order.  Ph[id number] Jonette Nestle, [place] - [address]

## 2023-05-13 NOTE — Telephone Encounter (Signed)
 He will need an appointment for further medication

## 2023-05-13 NOTE — Telephone Encounter (Signed)
 Spoke with patient and told him Marcus Jones would not send in anything without seeing him. Patient said he has an appointment with Dr. Cassondra Cliff next week and was not coming in before then.

## 2023-05-13 NOTE — Telephone Encounter (Signed)
 Spoke with Pharmacy, and they said the dexamethazone from the 10th of January was picked up. LMOM for patient to call back.

## 2023-05-18 ENCOUNTER — Encounter: Payer: BC Managed Care – PPO | Admitting: Internal Medicine

## 2023-05-19 ENCOUNTER — Encounter: Payer: BC Managed Care – PPO | Admitting: Internal Medicine

## 2023-07-17 ENCOUNTER — Other Ambulatory Visit: Payer: Self-pay

## 2023-07-17 DIAGNOSIS — B009 Herpesviral infection, unspecified: Secondary | ICD-10-CM

## 2023-07-17 DIAGNOSIS — K649 Unspecified hemorrhoids: Secondary | ICD-10-CM

## 2023-07-17 MED ORDER — ACYCLOVIR 800 MG PO TABS: ORAL_TABLET | ORAL | 99 refills | Status: AC

## 2023-07-17 MED ORDER — HYDROCORTISONE (PERIANAL) 2.5 % EX CREA: TOPICAL_CREAM | CUTANEOUS | 99 refills | Status: AC

## 2023-08-10 NOTE — Progress Notes (Addendum)
  [facility] Visit Note    Marcus Jones is a 53 y.o. male who presents for ppd read.SABRA   History obtained from patient.   Assessment & Plan:    Assessment & Plan Screening examination for pulmonary tuberculosis  Orders: .  TB skin read PPD    No follow-ups on file.  Subjective     HPI   Allergies  Allergen Reactions  . Amoxicillin Other (See Comments)  . Neomycin Other (See Comments)  . Penicillin Other (See Comments)    Review of Systems   Objective     Vital Signs: There were no vitals taken for this visit.   Physical Exam Constitutional:      Appearance: Normal appearance.  Neurological:     Mental Status: He is alert.                   Subjective:  Patient ID:  The patient is a 53 y.o. for a TB skin test.  HIV Infection?: No  Recent contact with person who has active TB?: No  Fibrotic changes on chest x-ray constistent with prior TB?: No  Organ transplant in the past?: No  Immunosuppression?: No  Recent arrival (<5 years) from high prevalence country?: No  Previous vacccination with BCG?: No  IV drug user?: No  Resident or employee of high risk congregate setting?: No  Mycobacterial lab personnel?: No  High risk clinical conditions?: No  Is the patent a child <76 years of age?: No  Is the patient an infant, child or adolescent exposed to adults in high risk categories?: No  Patient tested in [place] ?: No  Objective:   Assessment/Plan:  PPD read completed.  Results and recommendations reviewed with patient in accordance with [facility] Guidelines.

## 2023-11-02 ENCOUNTER — Ambulatory Visit: Payer: BC Managed Care – PPO | Admitting: Internal Medicine

## 2024-01-02 ENCOUNTER — Encounter (HOSPITAL_COMMUNITY): Payer: Self-pay | Admitting: Emergency Medicine

## 2024-01-02 ENCOUNTER — Emergency Department (HOSPITAL_COMMUNITY)
Admission: EM | Admit: 2024-01-02 | Discharge: 2024-01-03 | Disposition: A | Discharge status: Home / Self Care | Source: Ambulatory Visit | Attending: Emergency Medicine | Admitting: Emergency Medicine

## 2024-01-02 ENCOUNTER — Other Ambulatory Visit: Payer: Self-pay

## 2024-01-02 DIAGNOSIS — T374X1A Poisoning by anthelminthics, accidental (unintentional), initial encounter: Secondary | ICD-10-CM | POA: Insufficient documentation

## 2024-01-02 DIAGNOSIS — T50901A Poisoning by unspecified drugs, medicaments and biological substances, accidental (unintentional), initial encounter: Secondary | ICD-10-CM

## 2024-01-02 DIAGNOSIS — R059 Cough, unspecified: Secondary | ICD-10-CM | POA: Diagnosis present

## 2024-01-02 NOTE — ED Triage Notes (Signed)
 Pt here for miscalculating and taking too much Ivermectin. Pt states he hasn't been feeling well for awhile, self doses with Doxycycline (states has been on that about 6 days) and that he wasn't feeling any better, so he took the Ivermectin. States he got the grams and milligrams messed up , contacted Poison Control who ran some calculations and said he had taken a very large dose and that he should seek further evaluation at an emergency department.

## 2024-01-02 NOTE — ED Notes (Signed)
 Spoke with Edsel at [facility]. States pt to be observed for 4 hrs. If pt remains asymptomatic, he made be discharged. If pt becomes symptomatic, need to observe pt until back to baseline. Risk for liver issues, seizures, and hypotension. If pt develops any of those, recommend supportive therapies. Also recommend CBC, CMP if pt develops vomiting/diarrhea/changes in BP.

## 2024-01-03 ENCOUNTER — Emergency Department (HOSPITAL_COMMUNITY)

## 2024-01-03 LAB — COMPREHENSIVE METABOLIC PANEL WITH GFR
ALT: 21 U/L (ref 0–44)
AST: 22 U/L (ref 15–41)
Albumin: 3.7 g/dL (ref 3.5–5.0)
Alkaline Phosphatase: 46 U/L (ref 38–126)
Anion gap: 9 (ref 5–15)
BUN: 17 mg/dL (ref 6–20)
CO2: 22 mmol/L (ref 22–32)
Calcium: 9.2 mg/dL (ref 8.9–10.3)
Chloride: 105 mmol/L (ref 98–111)
Creatinine, Ser: 1.07 mg/dL (ref 0.61–1.24)
GFR, Estimated: >60 mL/min (ref >60)
Glucose, Bld: 97 mg/dL (ref 70–99)
Potassium: 3.7 mmol/L (ref 3.5–5.1)
Sodium: 136 mmol/L (ref 135–145)
Total Bilirubin: 0.6 mg/dL (ref 0.0–1.2)
Total Protein: 7 g/dL (ref 6.5–8.1)

## 2024-01-03 LAB — CBC
HCT: 44.2 % (ref 39.0–52.0)
Hemoglobin: 14.9 g/dL (ref 13.0–17.0)
MCH: 31.4 pg (ref 26.0–34.0)
MCHC: 33.7 g/dL (ref 30.0–36.0)
MCV: 93.1 fL (ref 80.0–100.0)
Platelets: 208 K/uL (ref 150–400)
RBC: 4.75 MIL/uL (ref 4.22–5.81)
RDW: 11.9 % (ref 11.5–15.5)
WBC: 8 K/uL (ref 4.0–10.5)
nRBC: 0 % (ref 0.0–0.2)

## 2024-01-03 NOTE — Discharge Instructions (Signed)
You were evaluated in the Emergency Department and after careful evaluation, we did not find any emergent condition requiring admission or further testing in the hospital.  Your exam/testing today is overall reassuring.  Please return to the Emergency Department if you experience any worsening of your condition.   Thank you for allowing us to be a part of your care. 

## 2024-01-03 NOTE — ED Provider Notes (Signed)
 [facility]-EMERGENCY DEPT [facility] Emergency Department Provider Note MRN:  [id number]  Arrival date & time: 01/03/24     Chief Complaint   Drug Overdose   History of Present Illness   Marcus Jones is a 53 y.o. year-old male with no pertinent past medical history presenting to the ED with chief complaint of drug overdose.  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  Multiple people from this meeting developed COVID-19.  He started having persistent cough and malaise and bodyaches 6 days ago, he felt this was very consistent with his prior experiences with COVID-19.  Has tested negative at home twice but was still convinced that he had COVID-19.  Decided to take horse paste ivermectin to combat this illness.  Accidentally took too much, Poison control advised him to come to the emergency department.  Patient denies any symptoms from the ingestion which occurred at 9:30 PM.  Estimated ingestion of 1000 mg of ivermectin.  Review of Systems  A thorough review of systems was obtained and all systems are negative except as noted in the HPI and PMH.   Patient's Health History    Past Medical History:  Diagnosis Date   ADD (attention deficit disorder)    Allergy    Arthritis    Family history of malignant neoplasm of gastrointestinal tract    Hyperlipemia     Past Surgical History:  Procedure Laterality Date   NASAL FRACTURE SURGERY     TONSILLECTOMY     WISDOM TOOTH EXTRACTION      Family History  Problem Relation Age of Onset   Colon polyps Mother    Colon cancer Father        x3   Clotting disorder Father    Prostate cancer Brother    Clotting disorder Brother     Social History   Socioeconomic History   Marital status: Divorced    Spouse name: Not on file   Number of children: 2   Years of education: college    Highest education level: Not on file  Occupational History   Occupation: Geologist, engineering: [employer]  Tobacco Use    Smoking status: Never   Smokeless tobacco: Never  Substance and Sexual Activity   Alcohol use: Yes    Comment: 1-2 weekl    Drug use: No   Sexual activity: Not on file  Other Topics Concern   Not on file  Social History Narrative   Patient lives at home alone.    Patient is divorced.    Patient has a degree.    Patient has 2 children.    Social Drivers of Corporate investment banker Strain: Not on file  Food Insecurity: Not on file  Transportation Needs: Not on file  Physical Activity: Not on file  Stress: Not on file  Social Connections: Not on file  Intimate Partner Violence: Not on file     Physical Exam   Vitals:   01/03/24 0323 01/03/24 0330  BP:  115/71  Pulse:  78  Resp: 13 16  Temp:    SpO2:  100%    CONSTITUTIONAL: Well-appearing, NAD NEURO/PSYCH:  Alert and oriented x 3, no focal deficits EYES:  eyes equal and reactive ENT/NECK:  no LAD, no JVD CARDIO: Regular rate, well-perfused, normal S1 and S2 PULM:  CTAB no wheezing or rhonchi GI/GU:  non-distended, non-tender MSK/SPINE:  No gross deformities, no edema SKIN:  no rash, atraumatic   *  Additional and/or pertinent findings included in MDM below  Diagnostic and Interventional Summary    EKG Interpretation Date/Time:    Ventricular Rate:    PR Interval:    QRS Duration:    QT Interval:    QTC Calculation:   R Axis:      Text Interpretation:         Labs Reviewed  CBC  COMPREHENSIVE METABOLIC PANEL WITH GFR    DG Chest Port 1 View  Final Result      Medications - No data to display   Procedures  /  Critical Care Procedures  ED Course and Medical Decision Making  Initial Impression and Ddx Nonsensical ingestion of ivermectin.  Patient well-appearing in no acute distress with no symptoms.  He is having lingering malaise and fatigue and cough for a week, will obtain labs to evaluate for anemia, electrolyte disturbance, chest x-ray to evaluate for pneumonia.  I spoke with poison  control, without postingestion symptoms there is not much diagnostics to be performed.  Will evaluate for new GI disturbance, hypotension, seizure activity, if no new symptoms can be discharged from a poison control perspective after a 4-hour observation here in the emergency department.  Past medical/surgical history that increases complexity of ED encounter: None  Interpretation of Diagnostics I personally reviewed the Chest Xray and my interpretation is as follows: No lobar opacity or pneumothorax  No significant blood count or electrolyte disturbance.  Patient Reassessment and Ultimate Disposition/Management     Continues to have no symptoms during 4 hours of observation, appropriate for discharge.  Patient management required discussion with the following services or consulting groups: Poison control  Complexity of Problems Addressed Acute illness or injury that poses threat of life of bodily function  Additional Data Reviewed and Analyzed Further history obtained from: None  Additional Factors Impacting ED Encounter Risk Consideration of hospitalization  Ozell HERO. Theadore, MD [facility] Emergency [facility] [email or web address]  Final Clinical Impressions(s) / ED Diagnoses     ICD-10-CM   1. Accidental drug overdose, initial encounter  T50.901A       ED Discharge Orders     None        Discharge Instructions Discussed with and Provided to Patient:     Discharge Instructions      You were evaluated in the Emergency Department and after careful evaluation, we did not find any emergent condition requiring admission or further testing in the hospital.  Your exam/testing today is overall reassuring.  Please return to the Emergency Department if you experience any worsening of your condition.   Thank you for allowing us  to be a part of your care.       Theadore Ozell HERO, MD 01/03/24 [phone]

## 2024-02-08 ENCOUNTER — Ambulatory Visit: Payer: BC Managed Care – PPO | Admitting: Nurse Practitioner

## 2024-05-31 ENCOUNTER — Encounter: Payer: BC Managed Care – PPO | Admitting: Internal Medicine
